# Patient Record
Sex: Male | Born: 2000 | Hispanic: Yes | Marital: Single | Smoking: Never smoker
Health system: Southern US, Community
[De-identification: ages and names within clinical notes are randomized; demographics above are authoritative.]

## PROBLEM LIST (undated history)

## (undated) DIAGNOSIS — F419 Anxiety disorder, unspecified: Secondary | ICD-10-CM

## (undated) HISTORY — PX: APPENDECTOMY: SHX54

## (undated) HISTORY — DX: Anxiety disorder, unspecified: F41.9

---
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
## 2021-01-26 ENCOUNTER — Observation Stay
Admission: EM | Admit: 2021-01-26 | Discharge: 2021-01-27 | Disposition: A | Discharge status: Home / Self Care | Payer: Medicaid Other | Attending: Surgery | Admitting: Surgery

## 2021-01-26 ENCOUNTER — Encounter
Admission: EM | Disposition: A | Discharge status: Home / Self Care | Payer: Self-pay | Source: Home / Self Care | Attending: Emergency Medicine

## 2021-01-26 ENCOUNTER — Other Ambulatory Visit: Payer: Self-pay

## 2021-01-26 ENCOUNTER — Encounter: Payer: Self-pay | Admitting: Emergency Medicine

## 2021-01-26 ENCOUNTER — Emergency Department: Payer: Medicaid Other

## 2021-01-26 DIAGNOSIS — K3580 Unspecified acute appendicitis: Secondary | ICD-10-CM | POA: Diagnosis present

## 2021-01-26 DIAGNOSIS — R1033 Periumbilical pain: Secondary | ICD-10-CM | POA: Diagnosis present

## 2021-01-26 DIAGNOSIS — K353 Acute appendicitis with localized peritonitis, without perforation or gangrene: Principal | ICD-10-CM | POA: Insufficient documentation

## 2021-01-26 DIAGNOSIS — Z20822 Contact with and (suspected) exposure to covid-19: Secondary | ICD-10-CM | POA: Insufficient documentation

## 2021-01-26 HISTORY — PX: XI ROBOTIC LAPAROSCOPIC ASSISTED APPENDECTOMY: SHX6877

## 2021-01-26 LAB — CBC
HCT: 45.1 % (ref 39.0–52.0)
Hemoglobin: 15.8 g/dL (ref 13.0–17.0)
MCH: 30 pg (ref 26.0–34.0)
MCHC: 35 g/dL (ref 30.0–36.0)
MCV: 85.6 fL (ref 80.0–100.0)
Platelets: 188 10*3/uL (ref 150–400)
RBC: 5.27 MIL/uL (ref 4.22–5.81)
RDW: 11.9 % (ref 11.5–15.5)
WBC: 13.8 10*3/uL — ABNORMAL HIGH (ref 4.0–10.5)
nRBC: 0 % (ref 0.0–0.2)

## 2021-01-26 LAB — COMPREHENSIVE METABOLIC PANEL
ALT: 24 U/L (ref 0–44)
AST: 28 U/L (ref 15–41)
Albumin: 4.9 g/dL (ref 3.5–5.0)
Alkaline Phosphatase: 50 U/L (ref 38–126)
Anion gap: 12 (ref 5–15)
BUN: 10 mg/dL (ref 6–20)
CO2: 18 mmol/L — ABNORMAL LOW (ref 22–32)
Calcium: 9.3 mg/dL (ref 8.9–10.3)
Chloride: 103 mmol/L (ref 98–111)
Creatinine, Ser: 0.54 mg/dL — ABNORMAL LOW (ref 0.61–1.24)
GFR, Estimated: >60 mL/min (ref >60)
Glucose, Bld: 122 mg/dL — ABNORMAL HIGH (ref 70–99)
Potassium: 4.3 mmol/L (ref 3.5–5.1)
Sodium: 133 mmol/L — ABNORMAL LOW (ref 135–145)
Total Bilirubin: 1.6 mg/dL — ABNORMAL HIGH (ref 0.3–1.2)
Total Protein: 7.4 g/dL (ref 6.5–8.1)

## 2021-01-26 LAB — URINALYSIS, COMPLETE (UACMP) WITH MICROSCOPIC
Bacteria, UA: NONE SEEN
Bilirubin Urine: NEGATIVE
Glucose, UA: NEGATIVE mg/dL
Hgb urine dipstick: NEGATIVE
Ketones, ur: 5 mg/dL — AB
Leukocytes,Ua: NEGATIVE
Nitrite: NEGATIVE
Protein, ur: NEGATIVE mg/dL
Specific Gravity, Urine: 1.025 (ref 1.005–1.030)
Squamous Epithelial / HPF: NONE SEEN (ref 0–5)
pH: 6 (ref 5.0–8.0)

## 2021-01-26 LAB — RESP PANEL BY RT-PCR (FLU A&B, COVID) ARPGX2
Influenza A by PCR: NEGATIVE
Influenza B by PCR: NEGATIVE
SARS Coronavirus 2 by RT PCR: NEGATIVE

## 2021-01-26 LAB — LIPASE, BLOOD: Lipase: 34 U/L (ref 11–51)

## 2021-01-26 SURGERY — APPENDECTOMY, ROBOT-ASSISTED, LAPAROSCOPIC
Anesthesia: General | Site: Abdomen

## 2021-01-26 MED ORDER — METRONIDAZOLE 500 MG/100ML IV SOLN
500.0000 mg | Freq: Three times a day (TID) | INTRAVENOUS | Status: DC
  Administered 2021-01-26: 500 mg via INTRAVENOUS
  Filled 2021-01-26 (×3): qty 100

## 2021-01-26 MED ORDER — ONDANSETRON HCL 4 MG/2ML IJ SOLN: 4.0000 mg | Freq: Four times a day (QID) | INTRAMUSCULAR | Status: DC | PRN

## 2021-01-26 MED ORDER — MIDAZOLAM HCL 2 MG/2ML IJ SOLN
INTRAMUSCULAR | Status: AC
  Filled 2021-01-26: qty 2

## 2021-01-26 MED ORDER — FENTANYL CITRATE (PF) 250 MCG/5ML IJ SOLN
INTRAMUSCULAR | Status: AC
  Filled 2021-01-26: qty 5

## 2021-01-26 MED ORDER — ONDANSETRON 4 MG PO TBDP: 4.0000 mg | ORAL_TABLET | Freq: Four times a day (QID) | ORAL | Status: DC | PRN

## 2021-01-26 MED ORDER — PROPOFOL 10 MG/ML IV BOLUS
INTRAVENOUS | Status: AC
  Filled 2021-01-26: qty 40

## 2021-01-26 MED ORDER — BUPIVACAINE HCL (PF) 0.5 % IJ SOLN
INTRAMUSCULAR | Status: AC
  Filled 2021-01-26: qty 30

## 2021-01-26 MED ORDER — CIPROFLOXACIN IN D5W 400 MG/200ML IV SOLN
400.0000 mg | Freq: Two times a day (BID) | INTRAVENOUS | Status: DC
  Administered 2021-01-26: 400 mg via INTRAVENOUS
  Filled 2021-01-26 (×2): qty 200

## 2021-01-26 MED ORDER — TRAMADOL HCL 50 MG PO TABS: 50.0000 mg | ORAL_TABLET | Freq: Four times a day (QID) | ORAL | Status: DC | PRN

## 2021-01-26 MED ORDER — SODIUM CHLORIDE 0.9 % IV SOLN: INTRAVENOUS | Status: DC

## 2021-01-26 MED ORDER — ENOXAPARIN SODIUM 40 MG/0.4ML IJ SOSY
40.0000 mg | PREFILLED_SYRINGE | INTRAMUSCULAR | Status: DC
  Filled 2021-01-26: qty 0.4

## 2021-01-26 MED ORDER — LIDOCAINE HCL (PF) 1 % IJ SOLN
INTRAMUSCULAR | Status: AC
  Filled 2021-01-26: qty 30

## 2021-01-26 MED ORDER — DOCUSATE SODIUM 100 MG PO CAPS: 100.0000 mg | ORAL_CAPSULE | Freq: Two times a day (BID) | ORAL | Status: DC | PRN

## 2021-01-26 MED ORDER — ONDANSETRON HCL 4 MG/2ML IJ SOLN
4.0000 mg | Freq: Once | INTRAMUSCULAR | Status: AC
  Administered 2021-01-26: 4 mg via INTRAVENOUS
  Filled 2021-01-26: qty 2

## 2021-01-26 MED ORDER — MORPHINE SULFATE (PF) 4 MG/ML IV SOLN
4.0000 mg | Freq: Once | INTRAVENOUS | Status: AC
Start: 2021-01-26 — End: 2021-01-26
  Administered 2021-01-26: 4 mg via INTRAVENOUS
  Filled 2021-01-26: qty 1

## 2021-01-26 MED ORDER — MORPHINE SULFATE (PF) 2 MG/ML IV SOLN
2.0000 mg | INTRAVENOUS | Status: DC | PRN
  Administered 2021-01-26: 2 mg via INTRAVENOUS
  Filled 2021-01-26: qty 1

## 2021-01-26 MED ORDER — EPINEPHRINE PF 1 MG/ML IJ SOLN
INTRAMUSCULAR | Status: AC
  Filled 2021-01-26: qty 1

## 2021-01-26 MED ORDER — MORPHINE SULFATE (PF) 2 MG/ML IV SOLN
2.0000 mg | Freq: Once | INTRAVENOUS | Status: AC
  Administered 2021-01-26: 2 mg via INTRAVENOUS
  Filled 2021-01-26: qty 1

## 2021-01-26 MED ORDER — HYDROCODONE-ACETAMINOPHEN 5-325 MG PO TABS
1.0000 | ORAL_TABLET | ORAL | Status: DC | PRN
Start: 2021-01-26 — End: 2021-01-27
  Administered 2021-01-26: 1 via ORAL
  Filled 2021-01-26: qty 1

## 2021-01-26 MED ORDER — SODIUM CHLORIDE 0.9 % IV BOLUS
500.0000 mL | Freq: Once | INTRAVENOUS | Status: AC
  Administered 2021-01-26: 500 mL via INTRAVENOUS

## 2021-01-26 SURGICAL SUPPLY — 66 items
ANCHOR TIS RET SYS 235ML (MISCELLANEOUS) ×2 IMPLANT
BAG INFUSER PRESSURE 100CC (MISCELLANEOUS) ×2 IMPLANT
BLADE SURG SZ11 CARB STEEL (BLADE) ×2 IMPLANT
CANISTER SUCT 1200ML W/VALVE (MISCELLANEOUS) IMPLANT
CANNULA REDUC XI 12-8 STAPL (CANNULA) ×1
CANNULA REDUCER 12-8 DVNC XI (CANNULA) ×1 IMPLANT
CHLORAPREP W/TINT 26 (MISCELLANEOUS) ×2 IMPLANT
COVER TIP SHEARS 8 DVNC (MISCELLANEOUS) ×1 IMPLANT
COVER TIP SHEARS 8MM DA VINCI (MISCELLANEOUS) ×1
COVER WAND RF STERILE (DRAPES) ×2 IMPLANT
DEFOGGER SCOPE WARMER CLEARIFY (MISCELLANEOUS) ×2 IMPLANT
DERMABOND ADVANCED (GAUZE/BANDAGES/DRESSINGS) ×1
DERMABOND ADVANCED .7 DNX12 (GAUZE/BANDAGES/DRESSINGS) ×1 IMPLANT
DRAPE ARM DVNC X/XI (DISPOSABLE) ×3 IMPLANT
DRAPE COLUMN DVNC XI (DISPOSABLE) ×1 IMPLANT
DRAPE DA VINCI XI ARM (DISPOSABLE) ×3
DRAPE DA VINCI XI COLUMN (DISPOSABLE) ×1
ELECT CAUTERY BLADE 6.4 (BLADE) ×2 IMPLANT
ELECT REM PT RETURN 9FT ADLT (ELECTROSURGICAL) ×2
ELECTRODE REM PT RTRN 9FT ADLT (ELECTROSURGICAL) ×1 IMPLANT
GLOVE SURG SYN 6.5 ES PF (GLOVE) ×10 IMPLANT
GLOVE SURG UNDER POLY LF SZ7 (GLOVE) ×8 IMPLANT
GOWN STRL REUS W/ TWL LRG LVL3 (GOWN DISPOSABLE) ×4 IMPLANT
GOWN STRL REUS W/TWL LRG LVL3 (GOWN DISPOSABLE) ×4
GRASPER SUT TROCAR 14GX15 (MISCELLANEOUS) ×2 IMPLANT
IRRIGATOR SUCT 8 DISP DVNC XI (IRRIGATION / IRRIGATOR) ×1 IMPLANT
IRRIGATOR SUCTION 8MM XI DISP (IRRIGATION / IRRIGATOR) ×1
IV NS 1000ML (IV SOLUTION) ×1
IV NS 1000ML BAXH (IV SOLUTION) ×1 IMPLANT
JACKSON PRATT 7MM (INSTRUMENTS) IMPLANT
KIT TURNOVER KIT A (KITS) ×2 IMPLANT
LABEL OR SOLS (LABEL) IMPLANT
MANIFOLD NEPTUNE II (INSTRUMENTS) ×2 IMPLANT
NEEDLE HYPO 22GX1.5 SAFETY (NEEDLE) ×2 IMPLANT
NEEDLE INSUFFLATION 14GA 120MM (NEEDLE) ×2 IMPLANT
OBTURATOR OPTICAL STANDARD 8MM (TROCAR) ×1
OBTURATOR OPTICAL STND 8 DVNC (TROCAR) ×1
OBTURATOR OPTICALSTD 8 DVNC (TROCAR) ×1 IMPLANT
PACK LAP CHOLECYSTECTOMY (MISCELLANEOUS) ×2 IMPLANT
PENCIL ELECTRO HAND CTR (MISCELLANEOUS) ×2 IMPLANT
RELOAD STAPLER 2.5X45 WHT DVNC (STAPLE) ×1 IMPLANT
RELOAD STAPLER 3.5X45 BLU DVNC (STAPLE) ×1 IMPLANT
SEAL CANN UNIV 5-8 DVNC XI (MISCELLANEOUS) ×3 IMPLANT
SEAL XI 5MM-8MM UNIVERSAL (MISCELLANEOUS) ×3
SET TUBE SMOKE EVAC HIGH FLOW (TUBING) ×2 IMPLANT
SOLUTION ELECTROLUBE (MISCELLANEOUS) ×2 IMPLANT
STAPLER 45 DA VINCI SURE FORM (STAPLE) ×1
STAPLER 45 SUREFORM DVNC (STAPLE) ×1 IMPLANT
STAPLER CANNULA SEAL DVNC XI (STAPLE) ×1 IMPLANT
STAPLER CANNULA SEAL XI (STAPLE) ×1
STAPLER RELOAD 2.5X45 WHITE (STAPLE) ×1
STAPLER RELOAD 2.5X45 WHT DVNC (STAPLE) ×1
STAPLER RELOAD 3.5X45 BLU DVNC (STAPLE) ×1
STAPLER RELOAD 3.5X45 BLUE (STAPLE) ×1
SUT ETHILON 3-0 FS-10 30 BLK (SUTURE)
SUT MNCRL 4-0 (SUTURE) ×1
SUT MNCRL 4-0 27XMFL (SUTURE) ×1
SUT MNCRL AB 4-0 PS2 18 (SUTURE) ×2 IMPLANT
SUT SILK 3 0 SH 30 (SUTURE) ×2 IMPLANT
SUT VIC AB 3-0 SH 27 (SUTURE) ×1
SUT VIC AB 3-0 SH 27X BRD (SUTURE) ×1 IMPLANT
SUT VICRYL 0 AB UR-6 (SUTURE) ×2 IMPLANT
SUTURE EHLN 3-0 FS-10 30 BLK (SUTURE) IMPLANT
SUTURE MNCRL 4-0 27XMF (SUTURE) ×1 IMPLANT
SYR 30ML LL (SYRINGE) ×2 IMPLANT
SYSTEM WECK SHIELD CLOSURE (TROCAR) IMPLANT

## 2021-01-26 NOTE — ED Provider Notes (Signed)
[facility] Emergency Department Provider Note   ____________________________________________    I have reviewed the triage vital signs and the nursing notes.   HISTORY  Chief Complaint Abdominal Pain     HPI Brendan Lloyd is a 20 y.o. male who presents with complaints of abdominal pain.  Patient describes periumbilical abdominal pain which started early this morning and has worsened throughout the day.  No history of abdominal surgery.  No fevers or chills.  Positive nausea no vomiting.  Normal stools.  Has not take anything for this.  No sick contacts reported.  No viral symptoms.   History reviewed. No pertinent past medical history.  There are no problems to display for this patient.   History reviewed. No pertinent surgical history.  Prior to Admission medications   Not on File     Allergies Amoxicillin  History reviewed. No pertinent family history.  Social History    Review of Systems  Constitutional: No fever/chills Eyes: No visual changes.  ENT: No sore throat. Cardiovascular: Denies chest pain. Respiratory: Denies shortness of breath. Gastrointestinal: As above Genitourinary: Negative for dysuria. Musculoskeletal: Negative for back pain. Skin: Negative for rash. Neurological: Negative for headaches or weakness   ____________________________________________   PHYSICAL EXAM:  VITAL SIGNS: ED Triage Vitals  Enc Vitals Group     BP 01/26/21 1704 121/87     Pulse Rate 01/26/21 1704 61     Resp 01/26/21 1704 18     Temp 01/26/21 1704 97.7 F (36.5 C)     Temp Source 01/26/21 1704 Oral     SpO2 01/26/21 1704 100 %     Weight 01/26/21 1659 56.7 kg (125 lb)     Height 01/26/21 1659 1.676 m (5\' 6" )     Head Circumference --      Peak Flow --      Pain Score 01/26/21 1659 10     Pain Loc --      Pain Edu? --      Excl. in GC? --     Constitutional: Alert and oriented. No acute distress.  Nose: No  congestion/rhinnorhea. Mouth/Throat: Mucous membranes are moist.   Neck:  Painless ROM Cardiovascular: Normal rate, regular rhythm. Grossly normal heart sounds.  Good peripheral circulation. Respiratory: Normal respiratory effort.  No retractions. Lungs CTAB. Gastrointestinal: Soft, periumbilical abdominal tenderness, no McBurney's point tenderness. No distention.  No CVA tenderness.  Musculoskeletal: Warm and well perfused Neurologic:  Normal speech and language. No gross focal neurologic deficits are appreciated.  Skin:  Skin is warm, dry and intact. No rash noted. Psychiatric: Mood and affect are normal. Speech and behavior are normal.  ____________________________________________   LABS (all labs ordered are listed, but only abnormal results are displayed)  Labs Reviewed  URINALYSIS, COMPLETE (UACMP) WITH MICROSCOPIC - Abnormal; Notable for the following components:      Result Value   Color, Urine YELLOW (*)    APPearance CLEAR (*)    Ketones, ur 5 (*)    All other components within normal limits  RESP PANEL BY RT-PCR (FLU A&B, COVID) ARPGX2  LIPASE, BLOOD  COMPREHENSIVE METABOLIC PANEL  CBC   ____________________________________________  EKG  None ____________________________________________  RADIOLOGY  CT reviewed by me, appendix is dilated, concerning for acute appendicitis ____________________________________________   PROCEDURES  Procedure(s) performed: No  Procedures   Critical Care performed: No ____________________________________________   INITIAL IMPRESSION / ASSESSMENT AND PLAN / ED COURSE  Pertinent labs & imaging results that were  available during my care of the patient were reviewed by me and considered in my medical decision making (see chart for details).  Patient presents with abdominal pain described above, periumbilical abdominal pain worsening over the last 24 hours, suspicious for appendicitis however no significant right lower  quadrant tenderness to palpation differential also includes enteritis, less likely colitis.  Pending labs, will treat with IV morphine, IV Zofran, IV fluids, will obtain CT and reevaluate  CT consistent with acute appendicitis, have consulted Dr. Tonna Boehringer of general surgery    ____________________________________________   FINAL CLINICAL IMPRESSION(S) / ED DIAGNOSES  Final diagnoses:  Acute appendicitis with localized peritonitis, without perforation, abscess, or gangrene        Note:  This document was prepared using Dragon voice recognition software and may include unintentional dictation errors.   Jene Every, MD 01/26/21 [phone]

## 2021-01-26 NOTE — ED Triage Notes (Signed)
Pt can be heard repeatedly moaning in the lobby and repeatedly stating "hurry up hurry up hurry up".

## 2021-01-26 NOTE — H&P (Signed)
Subjective:   CC: acute appendicitis  HPI:  Brendan Lloyd is a 20 y.o. male who is consulted by Cyril Loosen for evaluation of  above cc.  Symptoms were first noted 1 day ago. Pain is sharp, periumbilical,  Associated with N/V, exacerbated by nothing specific.     Past Medical History: none reported  Past Surgical History: circumcision  Family History: reviewed and not relevant to CC  Social History:  has no history on file for tobacco use, alcohol use, and drug use.  Current Medications:  Prior to Admission medications   Not on File    Allergies:  Allergies as of 01/26/2021 - Review Complete 01/26/2021  Allergen Reaction Noted  . Amoxicillin Anaphylaxis 01/26/2021    ROS:  General: Denies weight loss, weight gain, fatigue, fevers, chills, and night sweats. Eyes: Denies blurry vision, double vision, eye pain, itchy eyes, and tearing. Ears: Denies hearing loss, earache, and ringing in ears. Nose: Denies sinus pain, congestion, infections, runny nose, and nosebleeds. Mouth/throat: Denies hoarseness, sore throat, bleeding gums, and difficulty swallowing. Heart: Denies chest pain, palpitations, racing heart, irregular heartbeat, leg pain or swelling, and decreased activity tolerance. Respiratory: Denies breathing difficulty, shortness of breath, wheezing, cough, and sputum. GI: Denies change in appetite, heartburn, constipation, diarrhea, and blood in stool. GU: Denies difficulty urinating, pain with urinating, urgency, frequency, blood in urine. Musculoskeletal: Denies joint stiffness, pain, swelling, muscle weakness. Skin: Denies rash, itching, mass, tumors, sores, and boils Neurologic: Denies headache, fainting, dizziness, seizures, numbness, and tingling. Psychiatric: Denies depression, anxiety, difficulty sleeping, and memory loss. Endocrine: Denies heat or cold intolerance, and increased thirst or urination. Blood/lymph: Denies easy bruising, easy bruising, and swollen  glands     Objective:     BP 121/87 (BP Location: Left Arm)   Pulse 61   Temp 97.7 F (36.5 C) (Oral)   Resp 18   Ht 5\' 6"  (1.676 m)   Wt 56.7 kg   SpO2 100%   BMI 20.18 kg/m    Constitutional :  alert, cooperative, appears stated age and no distress  Lymphatics/Throat:  no asymmetry, masses, or scars  Respiratory:  clear to auscultation bilaterally  Cardiovascular:  regular rate and rhythm  Gastrointestinal: soft, no guarding, minor TTP in periumbilical area.   Musculoskeletal: Steady gait and movement  Skin: Cool and moist  Psychiatric: Normal affect, non-agitated, not confused       LABS:  CMP Latest Ref Rng & Units 01/26/2021  Glucose 70 - 99 mg/dL 03/28/2021)  BUN 6 - 20 mg/dL 10  Creatinine 144(Y - 1.85 mg/dL 6.31)  Sodium 4.97(W - 263 mmol/L 133(L)  Potassium 3.5 - 5.1 mmol/L 4.3  Chloride 98 - 111 mmol/L 103  CO2 22 - 32 mmol/L 18(L)  Calcium 8.9 - 10.3 mg/dL 9.3  Total Protein 6.5 - 8.1 g/dL 7.4  Total Bilirubin 0.3 - 1.2 mg/dL 785)  Alkaline Phos 38 - 126 U/L 50  AST 15 - 41 U/L 28  ALT 0 - 44 U/L 24   CBC Latest Ref Rng & Units 01/26/2021  WBC 4.0 - 10.5 K/uL 13.8(H)  Hemoglobin 13.0 - 17.0 g/dL 03/28/2021  Hematocrit 02.7 - 52.0 % 45.1  Platelets 150 - 400 K/uL 188     RADS: CLINICAL DATA:  Right lower quadrant abdominal pain  EXAM: CT ABDOMEN AND PELVIS WITHOUT CONTRAST  TECHNIQUE: Multidetector CT imaging of the abdomen and pelvis was performed following the standard protocol without IV contrast.  COMPARISON:  None.  FINDINGS: Lower chest: Lung  bases are clear.  Hepatobiliary: Unenhanced liver is unremarkable.  Gallbladder is unremarkable. No intrahepatic or extrahepatic ductal dilatation.  Pancreas: Within normal limits.  Spleen: Within normal limits.  Adrenals/Urinary Tract: Adrenal glands are within normal limits.  Kidneys are within normal limits. No renal, ureteral, or bladder calculi. No hydronephrosis.  Bladder is  within normal limits.  Stomach/Bowel: Stomach is within normal limits.  No evidence of bowel obstruction.  Abnormal appearance of the appendix, with associated appendicolith (series 2/image 46) in dilatation of the mid appendix measuring up to 11 mm (series 2/image 48), suspicious for early acute appendicitis.  No drainable fluid collection/abscess. No free air to suggest macroscopic perforation.  Mild wall thickening involving the cecum (series 2/image 50), favored to be secondary/reactive.  Vascular/Lymphatic: No evidence of abdominal aortic aneurysm.  No suspicious abdominopelvic lymphadenopathy.  Reproductive: 19 mm cystic lesion midline posterior prostate (series 2/image 33), favoring a prostatic utricle cyst.  Other: No abdominopelvic ascites.  Musculoskeletal: Visualized osseous structures are within normal limits.  IMPRESSION: Abnormal appendix, favoring early acute appendicitis, with associated appendicolith.  No evidence of complication.   Electronically Signed   By: Charline Bills M.D.   On: 01/26/2021 18:18  Assessment:      Acute appendicitis  Plan:      Discussed the risk of surgery including post-op infxn, seroma, hematoma, abscess formation, chronic pain, poor-delayed wound healing, possible bowel resection, possible ostomy, possible conversion to open procedure, post-op SBO or ileus, and need for additional procedures to address said risks.  The risks of general anesthetic including MI, CVA, sudden death or even reaction to anesthetic medications also discussed. Alternatives include continued observation, or antibiotic treatment.  Benefits include possible symptom relief,   Typical post operative recovery of 3-5 days rest, also discussed.  The patient and father at bedside understands the risks, any and all questions were answered to the patient's satisfaction.  IVF, abx, NPO, pain control until OR available for robotic  assisted lap appy.

## 2021-01-26 NOTE — ED Triage Notes (Signed)
Pt to ED via POV with c/o lower abdominal pain that started this morning. Pt states attempted to have BM earlier but was unable to have a BM. Pt also endorses emesis at this time.

## 2021-01-26 NOTE — ED Triage Notes (Signed)
Pt can be heard yelling out "who do I have to talk to get seen? Hurry up!" This RN spoke with patient, apologized for delay, explained VSS, and that patient would be pulled back for blood work shortly. Pt states understanding given a urine cup for urine.

## 2021-01-27 ENCOUNTER — Inpatient Hospital Stay: Payer: Medicaid Other | Admitting: Anesthesiology

## 2021-01-27 ENCOUNTER — Encounter: Payer: Self-pay | Admitting: Surgery

## 2021-01-27 LAB — BASIC METABOLIC PANEL
Anion gap: 4 — ABNORMAL LOW (ref 5–15)
BUN: 10 mg/dL (ref 6–20)
CO2: 24 mmol/L (ref 22–32)
Calcium: 8.9 mg/dL (ref 8.9–10.3)
Chloride: 102 mmol/L (ref 98–111)
Creatinine, Ser: 0.62 mg/dL (ref 0.61–1.24)
GFR, Estimated: >60 mL/min (ref >60)
Glucose, Bld: 153 mg/dL — ABNORMAL HIGH (ref 70–99)
Potassium: 3.8 mmol/L (ref 3.5–5.1)
Sodium: 130 mmol/L — ABNORMAL LOW (ref 135–145)

## 2021-01-27 LAB — CBC
HCT: 41.3 % (ref 39.0–52.0)
Hemoglobin: 14.8 g/dL (ref 13.0–17.0)
MCH: 30 pg (ref 26.0–34.0)
MCHC: 35.8 g/dL (ref 30.0–36.0)
MCV: 83.6 fL (ref 80.0–100.0)
Platelets: 235 10*3/uL (ref 150–400)
RBC: 4.94 MIL/uL (ref 4.22–5.81)
RDW: 11.9 % (ref 11.5–15.5)
WBC: 14.1 10*3/uL — ABNORMAL HIGH (ref 4.0–10.5)
nRBC: 0 % (ref 0.0–0.2)

## 2021-01-27 LAB — MAGNESIUM: Magnesium: 1.7 mg/dL (ref 1.7–2.4)

## 2021-01-27 LAB — HIV ANTIBODY (ROUTINE TESTING W REFLEX): HIV Screen 4th Generation wRfx: NONREACTIVE

## 2021-01-27 LAB — PHOSPHORUS: Phosphorus: 2.8 mg/dL (ref 2.5–4.6)

## 2021-01-27 MED ORDER — ACETAMINOPHEN 10 MG/ML IV SOLN
INTRAVENOUS | Status: AC
  Filled 2021-01-27: qty 100

## 2021-01-27 MED ORDER — OXYCODONE HCL 5 MG PO TABS: 5.0000 mg | ORAL_TABLET | Freq: Once | ORAL | Status: DC | PRN

## 2021-01-27 MED ORDER — DOCUSATE SODIUM 100 MG PO CAPS: 100.0000 mg | ORAL_CAPSULE | Freq: Two times a day (BID) | ORAL | 0 refills | Status: AC | PRN

## 2021-01-27 MED ORDER — MIDAZOLAM HCL 2 MG/2ML IJ SOLN
INTRAMUSCULAR | Status: DC | PRN
  Administered 2021-01-27: 2 mg via INTRAVENOUS

## 2021-01-27 MED ORDER — SEVOFLURANE IN SOLN
RESPIRATORY_TRACT | Status: AC
  Filled 2021-01-27: qty 250

## 2021-01-27 MED ORDER — DEXAMETHASONE SODIUM PHOSPHATE 10 MG/ML IJ SOLN
INTRAMUSCULAR | Status: DC | PRN
  Administered 2021-01-27: 10 mg via INTRAVENOUS

## 2021-01-27 MED ORDER — ACETAMINOPHEN 10 MG/ML IV SOLN: 1000.0000 mg | Freq: Once | INTRAVENOUS | Status: DC | PRN

## 2021-01-27 MED ORDER — IBUPROFEN 800 MG PO TABS: 800.0000 mg | ORAL_TABLET | Freq: Three times a day (TID) | ORAL | 0 refills | Status: DC | PRN

## 2021-01-27 MED ORDER — OXYCODONE HCL 5 MG/5ML PO SOLN: 5.0000 mg | Freq: Once | ORAL | Status: DC | PRN

## 2021-01-27 MED ORDER — ACETAMINOPHEN 325 MG PO TABS: 650.0000 mg | ORAL_TABLET | Freq: Three times a day (TID) | ORAL | 0 refills | Status: AC | PRN

## 2021-01-27 MED ORDER — LIDOCAINE-EPINEPHRINE (PF) 1 %-1:200000 IJ SOLN
INTRAMUSCULAR | Status: DC | PRN
  Administered 2021-01-27: 7.5 mL

## 2021-01-27 MED ORDER — PROPOFOL 10 MG/ML IV BOLUS
INTRAVENOUS | Status: DC | PRN
  Administered 2021-01-27: 150 mg via INTRAVENOUS

## 2021-01-27 MED ORDER — FENTANYL CITRATE (PF) 100 MCG/2ML IJ SOLN: 25.0000 ug | INTRAMUSCULAR | Status: DC | PRN

## 2021-01-27 MED ORDER — SUCCINYLCHOLINE CHLORIDE 20 MG/ML IJ SOLN
INTRAMUSCULAR | Status: DC | PRN
  Administered 2021-01-27: 80 mg via INTRAVENOUS

## 2021-01-27 MED ORDER — HYDROCODONE-ACETAMINOPHEN 5-325 MG PO TABS: 1.0000 | ORAL_TABLET | Freq: Four times a day (QID) | ORAL | 0 refills | Status: DC | PRN

## 2021-01-27 MED ORDER — FENTANYL CITRATE (PF) 100 MCG/2ML IJ SOLN
INTRAMUSCULAR | Status: DC | PRN
  Administered 2021-01-27: 100 ug via INTRAVENOUS
  Administered 2021-01-27 (×2): 50 ug via INTRAVENOUS

## 2021-01-27 MED ORDER — BUPIVACAINE HCL (PF) 0.5 % IJ SOLN
INTRAMUSCULAR | Status: DC | PRN
  Administered 2021-01-27: 7.5 mL

## 2021-01-27 MED ORDER — ACETAMINOPHEN 10 MG/ML IV SOLN
INTRAVENOUS | Status: DC | PRN
  Administered 2021-01-27: 1000 mg via INTRAVENOUS

## 2021-01-27 MED ORDER — SUGAMMADEX SODIUM 200 MG/2ML IV SOLN
INTRAVENOUS | Status: DC | PRN
  Administered 2021-01-27: 150 mg via INTRAVENOUS

## 2021-01-27 MED ORDER — ONDANSETRON HCL 4 MG/2ML IJ SOLN
INTRAMUSCULAR | Status: DC | PRN
  Administered 2021-01-27: 4 mg via INTRAVENOUS

## 2021-01-27 MED ORDER — ONDANSETRON HCL 4 MG/2ML IJ SOLN: 4.0000 mg | Freq: Once | INTRAMUSCULAR | Status: DC | PRN

## 2021-01-27 MED ORDER — LIDOCAINE HCL (CARDIAC) PF 100 MG/5ML IV SOSY
PREFILLED_SYRINGE | INTRAVENOUS | Status: DC | PRN
  Administered 2021-01-27: 60 mg via INTRAVENOUS

## 2021-01-27 MED ORDER — METHYLENE BLUE 0.5 % INJ SOLN
INTRAVENOUS | Status: AC
  Filled 2021-01-27: qty 10

## 2021-01-27 MED ORDER — ROCURONIUM BROMIDE 100 MG/10ML IV SOLN
INTRAVENOUS | Status: DC | PRN
  Administered 2021-01-27: 30 mg via INTRAVENOUS

## 2021-01-27 NOTE — Anesthesia Procedure Notes (Signed)
Procedure Name: Intubation Date/Time: 01/27/2021 12:34 AM Performed by: Waldo Laine, CRNA Pre-anesthesia Checklist: Patient identified, Patient being monitored, Timeout performed, Emergency Drugs available and Suction available Patient Re-evaluated:Patient Re-evaluated prior to induction Oxygen Delivery Method: Circle system utilized Preoxygenation: Pre-oxygenation with 100% oxygen Induction Type: IV induction and Rapid sequence Laryngoscope Size: 3 and McGraph Grade View: Grade I Tube type: Oral Tube size: 7.0 mm Number of attempts: 1 Airway Equipment and Method: Stylet Placement Confirmation: ETT inserted through vocal cords under direct vision,  positive ETCO2 and breath sounds checked- equal and bilateral Secured at: 21 cm Tube secured with: Tape Dental Injury: Teeth and Oropharynx as per pre-operative assessment

## 2021-01-27 NOTE — Anesthesia Preprocedure Evaluation (Signed)
Anesthesia Evaluation  Patient identified by MRN, date of birth, ID band Patient awake    Reviewed: Allergy & Precautions, NPO status , Patient's Chart, lab work & pertinent test results  History of Anesthesia Complications Negative for: history of anesthetic complications  Airway Mallampati: III  TM Distance: >3 FB Neck ROM: Full    Dental no notable dental hx. (+) Teeth Intact   Pulmonary neg pulmonary ROS, neg sleep apnea, neg COPD, Patient abstained from smoking.Not current smoker,    Pulmonary exam normal breath sounds clear to auscultation       Cardiovascular Exercise Tolerance: Good METS(-) hypertension(-) CAD and (-) Past MI negative cardio ROS  (-) dysrhythmias  Rhythm:Regular Rate:Normal - Systolic murmurs    Neuro/Psych negative neurological ROS  negative psych ROS   GI/Hepatic neg GERD  ,(+)     (-) substance abuse  ,   Endo/Other  neg diabetes  Renal/GU negative Renal ROS     Musculoskeletal   Abdominal   Peds  Hematology   Anesthesia Other Findings History reviewed. No pertinent past medical history.  Reproductive/Obstetrics                             Anesthesia Physical Anesthesia Plan  ASA: II  Anesthesia Plan: General   Post-op Pain Management:    Induction: Intravenous  PONV Risk Score and Plan: 3 and Ondansetron, Dexamethasone and Midazolam  Airway Management Planned: Oral ETT  Additional Equipment: None  Intra-op Plan:   Post-operative Plan: Extubation in OR  Informed Consent: I have reviewed the patients History and Physical, chart, labs and discussed the procedure including the risks, benefits and alternatives for the proposed anesthesia with the patient or authorized representative who has indicated his/her understanding and acceptance.     Dental advisory given  Plan Discussed with: CRNA and Surgeon  Anesthesia Plan Comments: (Discussed  risks of anesthesia with patient, including PONV, sore throat, lip/dental damage. Rare risks discussed as well, such as cardiorespiratory and neurological sequelae. Patient understands.)        Anesthesia Quick Evaluation

## 2021-01-27 NOTE — Transfer of Care (Signed)
Immediate Anesthesia Transfer of Care Note  Patient: Brendan Lloyd  Procedure(s) Performed: XI ROBOTIC LAPAROSCOPIC ASSISTED APPENDECTOMY (N/A Abdomen)  Patient Location: PACU  Anesthesia Type:General  Level of Consciousness: sedated  Airway & Oxygen Therapy: Patient Spontanous Breathing and Patient connected to face mask oxygen  Post-op Assessment: Report given to RN and Post -op Vital signs reviewed and stable  Post vital signs: Reviewed and stable  Last Vitals:  Vitals Value Taken Time  BP    Temp    Pulse 97 01/27/21 0229  Resp 19 01/27/21 0229  SpO2 99 % 01/27/21 0229  Vitals shown include unvalidated device data.  Last Pain:  Vitals:   01/26/21 2340  TempSrc:   PainSc: 6       Patients Stated Pain Goal: 0 (01/26/21 2259)  Complications: No complications documented.

## 2021-01-27 NOTE — OR Nursing (Signed)
Pt voided prior to Or transport per MD verbal order.

## 2021-01-27 NOTE — Discharge Instructions (Signed)
Laparoscopic Appendectomy, Care After This sheet gives you information about how to care for yourself after your procedure. Your doctor may also give you more specific instructions. If you have problems or questions, contact your doctor. Follow these instructions at home: Care for cuts from surgery (incisions)   Follow instructions from your doctor about how to take care of your cuts from surgery. Make sure you: ? Wash your hands with soap and water before you change your bandage (dressing). If you cannot use soap and water, use hand sanitizer. ? Change your bandage as told by your doctor. ? Leave stitches (sutures), skin glue, or skin tape (adhesive) strips in place. They may need to stay in place for 2 weeks or longer. If tape strips get loose and curl up, you may trim the loose edges. Do not remove tape strips completely unless your doctor says it is okay.  Do not take baths, swim, or use a hot tub until your doctor says it is okay. OK TO SHOWER 24HRS AFTER YOUR SURGERY.   Check your surgical cut area every day for signs of infection. Check for: ? More redness, swelling, or pain. ? More fluid or blood. ? Warmth. ? Pus or a bad smell. Activity  Do not drive or use heavy machinery while taking prescription pain medicine.  Do not play contact sports until your doctor says it is okay.  Do not drive for 24 hours if you were given a medicine to help you relax (sedative).  Rest as needed. Do not return to work or school until your doctor says it is okay. General instructions .  tylenol and advil as needed for discomfort.  Please alternate between the two every four hours as needed for pain.   .  Use narcotics, if prescribed, only when tylenol and motrin is not enough to control pain. .  325-650mg every 8hrs to max of 3000mg/24hrs (including the 325mg in every norco dose) for the tylenol.   .  Advil up to 800mg per dose every 8hrs as needed for pain.    To prevent or treat constipation  while you are taking prescription pain medicine, your doctor may recommend that you: ? Drink enough fluid to keep your pee (urine) clear or pale yellow. ? Take over-the-counter or prescription medicines. ? Eat foods that are high in fiber, such as fresh fruits and vegetables, whole grains, and beans. ? Limit foods that are high in fat and processed sugars, such as fried and sweet foods. Contact a doctor if:  You develop a rash.  You have more redness, swelling, or pain around your surgical cuts.  You have more fluid or blood coming from your surgical cuts.  Your surgical cuts feel warm to the touch.  You have pus or a bad smell coming from your surgical cuts.  You have a fever.  One or more of your surgical cuts breaks open. Get help right away if:  You have trouble breathing.  You have chest pain.  You have pain that is getting worse in your shoulders.  You faint or feel dizzy when you stand.  You have very bad pain in your belly (abdomen).  You are sick to your stomach (nauseous) for more than one day.  You have throwing up (vomiting) that lasts for more than one day.  You have leg pain. This information is not intended to replace advice given to you by your health care provider. Make sure you discuss any questions you have with your   health care provider. Document Released: 05/18/2008 Document Revised: 02/28/2016 Document Reviewed: 01/26/2016 Elsevier Interactive Patient Education  2019 [employer] Inc[employer]   

## 2021-01-27 NOTE — Progress Notes (Signed)
Brendan Lloyd to be D/C'd Home per MD order.  Discussed prescriptions and follow up appointments with the patient. Prescriptions given to patient, medication list explained in detail. Pt verbalized understanding.  Allergies as of 01/27/2021      Reactions   Amoxicillin Anaphylaxis      Medication List    TAKE these medications   acetaminophen 325 MG tablet Commonly known as: Tylenol Take 2 tablets (650 mg total) by mouth every 8 (eight) hours as needed for mild pain.   docusate sodium 100 MG capsule Commonly known as: Colace Take 1 capsule (100 mg total) by mouth 2 (two) times daily as needed for up to 10 days for mild constipation.   HYDROcodone-acetaminophen 5-325 MG tablet Commonly known as: Norco Take 1 tablet by mouth every 6 (six) hours as needed for up to 6 doses for moderate pain.   ibuprofen 800 MG tablet Commonly known as: ADVIL Take 1 tablet (800 mg total) by mouth every 8 (eight) hours as needed for mild pain or moderate pain.       Vitals:   01/27/21 0930 01/27/21 1124  BP: 136/78 134/70  Pulse: 77 83  Resp: 16   Temp: 98.5 F (36.9 C) 98.6 F (37 C)  SpO2: 98% 100%    Skin clean, dry and intact without evidence of skin break down, no evidence of skin tears noted. IV catheter discontinued intact. Site without signs and symptoms of complications. Dressing and pressure applied. Pt denies pain at this time. No complaints noted.  An After Visit Summary was printed and given to the patient. Patient escorted via WC, and D/C home via private auto.   Madie Reno, RN

## 2021-01-27 NOTE — Discharge Summary (Signed)
Physician Discharge Summary  Patient ID: Brendan Lloyd MRN: [id number] DOB/AGE: August 12, 2001 20 y.o.  Admit date: 01/26/2021 Discharge date: 01/27/21  Admission Diagnoses: acute appendicitis  Discharge Diagnoses:  Same as above  Discharged Condition: good  Hospital Course: admitted for above.  Underwent robotic lap appy.  See op note for details.  Post op, recovered as expected, with pain controlled, tolerating diet at time of d/c.  Consults: None  Discharge Exam: Blood pressure 134/70, pulse 83, temperature 98.6 F (37 C), temperature source Oral, resp. rate 16, height 5\' 6"  (1.676 m), weight 56.7 kg, SpO2 100 %. General appearance: alert, cooperative and no distress GI: abnormal findings:  soft, TTP around incision sites with some bloating secondary to insufflation  Disposition:  Discharge disposition: 01-Home or Self Care       Discharge Instructions    Discharge patient   Complete by: As directed    After tolerating regular diet for lunch   Discharge disposition: 01-Home or Self Care   Discharge patient date: 01/27/2021     Allergies as of 01/27/2021      Reactions   Amoxicillin Anaphylaxis      Medication List    TAKE these medications   acetaminophen 325 MG tablet Commonly known as: Tylenol Take 2 tablets (650 mg total) by mouth every 8 (eight) hours as needed for mild pain.   docusate sodium 100 MG capsule Commonly known as: Colace Take 1 capsule (100 mg total) by mouth 2 (two) times daily as needed for up to 10 days for mild constipation.   HYDROcodone-acetaminophen 5-325 MG tablet Commonly known as: Norco Take 1 tablet by mouth every 6 (six) hours as needed for up to 6 doses for moderate pain.   ibuprofen 800 MG tablet Commonly known as: ADVIL Take 1 tablet (800 mg total) by mouth every 8 (eight) hours as needed for mild pain or moderate pain.       Follow-up Information    03/29/2021, [name], DO Follow up in 2 week(s).   Specialty: Surgery Why: post op  lap appy Contact information: [address] [place] [phone]                Total time spent arranging discharge was >67min. Signed: 31m 01/27/2021, 12:27 PM

## 2021-01-27 NOTE — Progress Notes (Signed)
Pt. Arrived from OR @0315 . Pt. Orient and alert X 4. Pt. Denies any pain, but report mild abdominal discomfort from surgery site. Pt. HOB at 30 degree. Pt. on room air, no sign of respiratory distress. Continue to monitor

## 2021-01-27 NOTE — Op Note (Signed)
Preoperative diagnosis: acute appendicitis  Postoperative diagnosis: Same  Procedure: Robotic assisted laparoscopic appendectomy.  Anesthesia: GETA  Surgeon: Sung Amabile  Wound Classification: clean contaminated  Specimen: Appendix  Complications: None  Estimated Blood Loss: 3 mL   Indications: Patient is a 20 y.o. male  presented with above.  Please see H&P for further details.    FIndings: 1.  Irritated appendix  2. No peri-appendiceal abscess or phlegmon 3. Normal anatomy 4. Appendiceal artery ligated and divided with stapler 5. Adequate hemostasis.   Description of procedure: The patient was placed on the operating table in the supine position, left arm tucked. General anesthesia was induced. A time-out was completed verifying correct patient, procedure, site, positioning, and implant(s) and/or special equipment prior to beginning this procedure. The abdomen was prepped and draped in the usual sterile fashion.   Palmer's point located and Veress needle was inserted.  After confirming 2 clicks and a positive saline drop test, gas insufflation was initiated until the abdominal pressure was measured at 15 mmHg.  Afterwards, the Veress needle was removed and a 8 mm port was placed through a periumbilical site using Optiview technique after incision with an 11 blade.  After local was infused, 2 additional incision was made 8 cm apart each side along the left side of the abdominal wall from the initial incision.  An 8 mm port was caudaed and 63mm port cephalad from initial incision, both under direct visualization.  No injuries from trocar placements were noted. The table was placed in the Trendelenburg position with the right side elevated.  Xi robotic platform was then brought to the operative field and docked.  An inflamed appendix was identified and elevated after dissecting the lateral attachments.  Window created at base of appendix in the mesentery.   A blue load linear cutting  stapler was then used to divide and staple the base of the appendix. It was reloaded with a vascular cartridge and the mesoappendix similarly divided.  Bleeding from the mesoappendix staple line was controlled with bipolar cautery.   The appendix was placed in an endoscopic retrieval bag and removed.   The appendiceal stump and mesoappendix staple line examined again and hemostasis noted.  Examination of the abdomen noted a possible pinpoint injury with the Veress needle to the anterior portion of the stomach.  No obvious bleeding or discharge was noted.  Methylene blue was infused through the NG in the stomach distended with again no extravasation of methylene blue through the possible injury site.  Due to the very small nature and negative leak test, decision was made to forego any patient intervention at this site.    The 12 mm trocar removed and port site closed with PMI using 0 vicryl under direct vision. Remaining trocars were removed under direct vision. No bleeding was noted.The abdomen was allowed to collapse. 3-0 vicryl interrupted used to close 43mm port site at dermal level, and then all skin incisions then closed with subcuticular sutures Monocryl 4-0.  Wounds then dressed with dermabond.  The patient tolerated the procedure well, awakened from anesthesia and was taken to the postanesthesia care unit in satisfactory condition.  Sponge count and instrument count correct at the end of the procedure.

## 2021-01-27 NOTE — Anesthesia Postprocedure Evaluation (Signed)
Anesthesia Post Note  Patient: [name]  Procedure(s) Performed: XI ROBOTIC LAPAROSCOPIC ASSISTED APPENDECTOMY (N/A Abdomen)  Patient location during evaluation: PACU Anesthesia Type: General Level of consciousness: awake and alert Pain management: pain level controlled Vital Signs Assessment: post-procedure vital signs reviewed and stable Respiratory status: spontaneous breathing, nonlabored ventilation, respiratory function stable and patient connected to nasal cannula oxygen Cardiovascular status: blood pressure returned to baseline and stable Postop Assessment: no apparent nausea or vomiting Anesthetic complications: no   No complications documented.   Last Vitals:  Vitals:   01/27/21 0359 01/27/21 0500  BP: 129/69 119/62  Pulse: 89 72  Resp: 16 16  Temp: 36.6 C 36.8 C  SpO2: 100% 98%    Last Pain:  Vitals:   01/27/21 0500  TempSrc: Oral  PainSc:                  Corinda Gubler

## 2021-01-28 LAB — SURGICAL PATHOLOGY

## 2021-11-07 IMAGING — CT CT ABD-PELV W/O CM
2 of 4 series · 16 of 46 positions shown, 18 images · non-contrast
Comparison: None.

CLINICAL DATA: Right lower quadrant abdominal pain

EXAM:
CT ABDOMEN AND PELVIS WITHOUT CONTRAST
TECHNIQUE: Multidetector CT imaging of the abdomen and pelvis was performed
following the standard protocol without IV contrast.

[Series 2: axial st · axial · 0.64mm/px · z∈[-477,-72]mm · 13 of 89 slices shown, 15 images]
[im 4/89  soft-tissue]
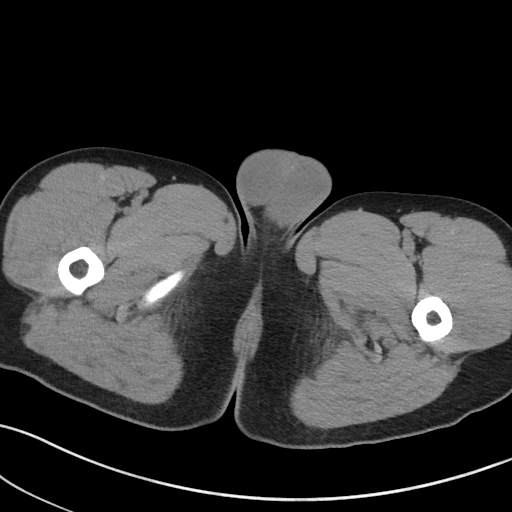
[im 4/89  bone]
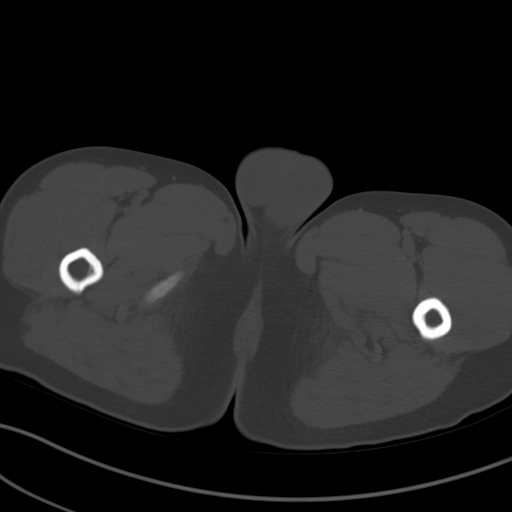
[im 11/89  soft-tissue]
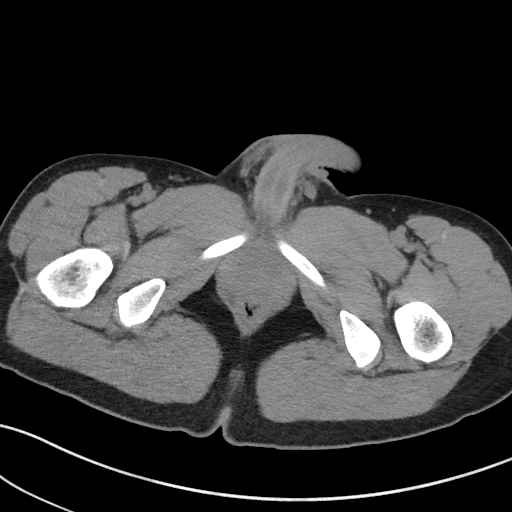
[im 17/89  soft-tissue]
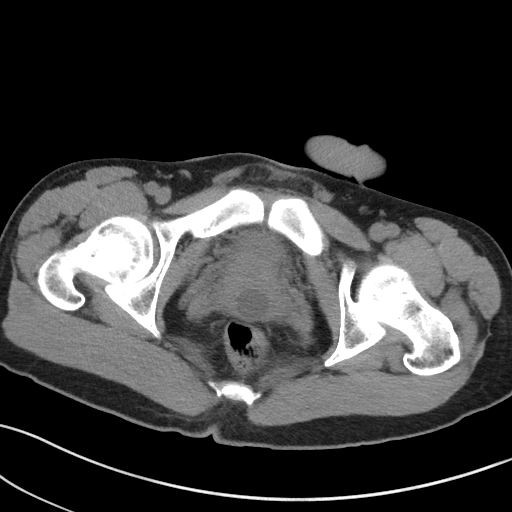
[im 24/89  soft-tissue]
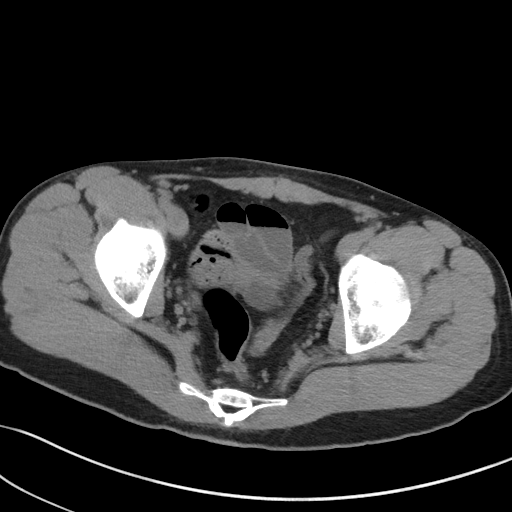
[im 31/89  soft-tissue]
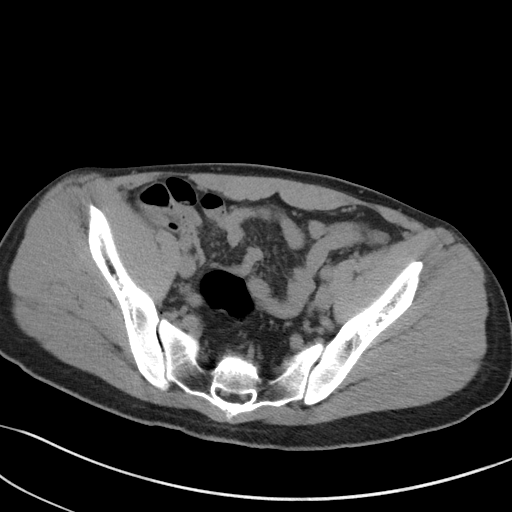
[im 38/89  soft-tissue]
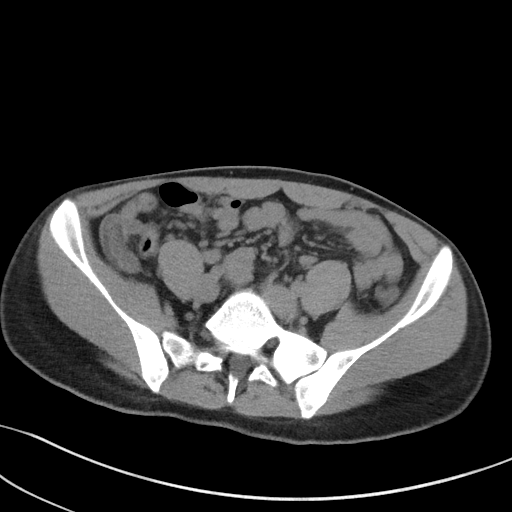
[im 45/89  soft-tissue]
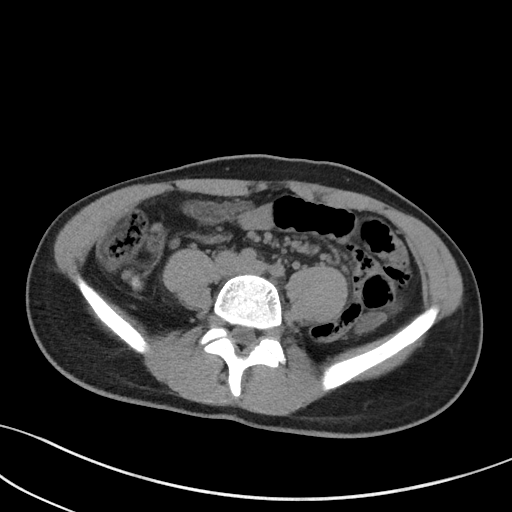
[im 51/89  soft-tissue]
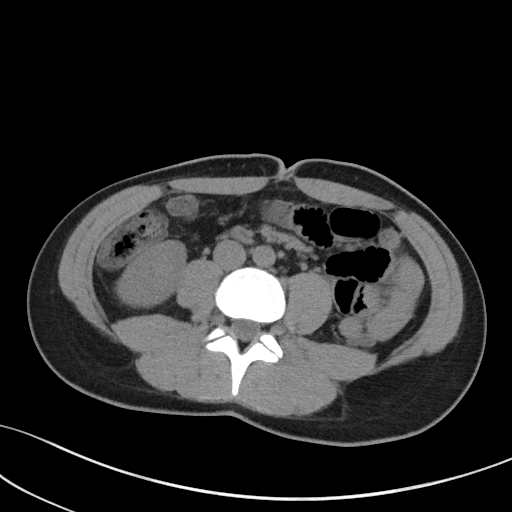
[im 58/89  soft-tissue]
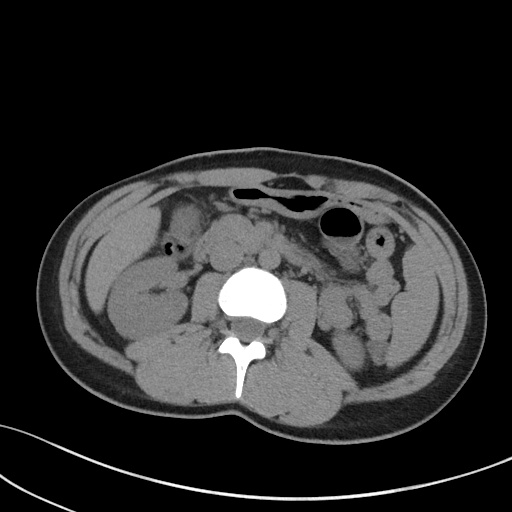
[im 58/89  bone]
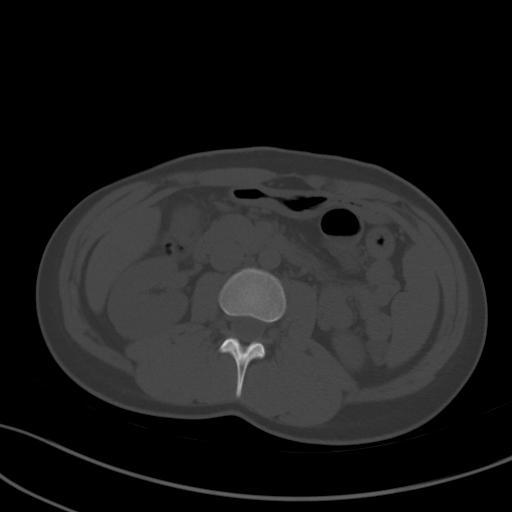
[im 65/89  soft-tissue]
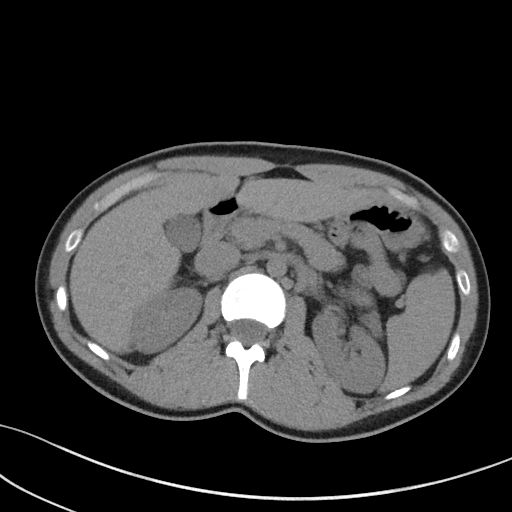
[im 72/89  soft-tissue]
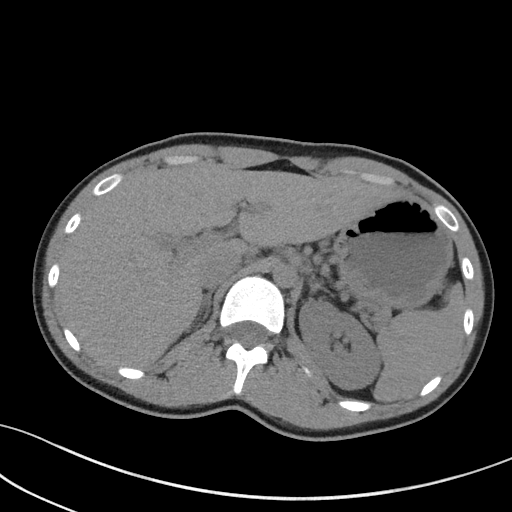
[im 78/89  soft-tissue]
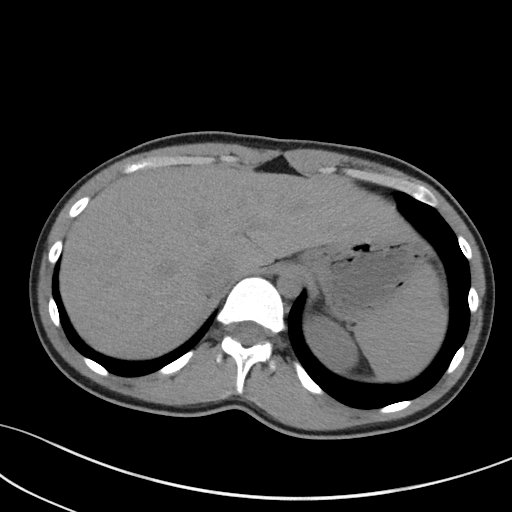
[im 85/89  soft-tissue]
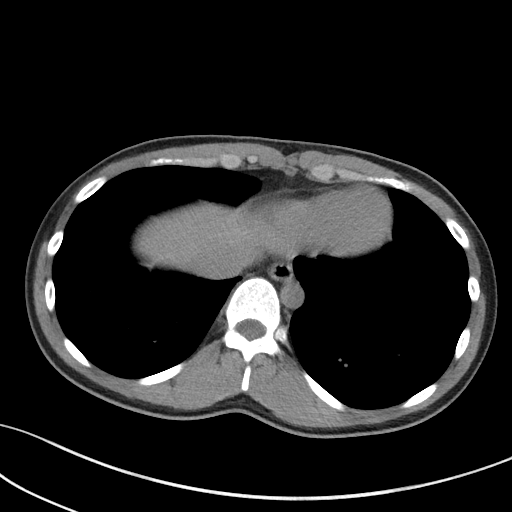

[Series 4: coronal st · coronal · 0.73mm/px · 3 of 68 slices shown]
[im 23/68  soft-tissue]
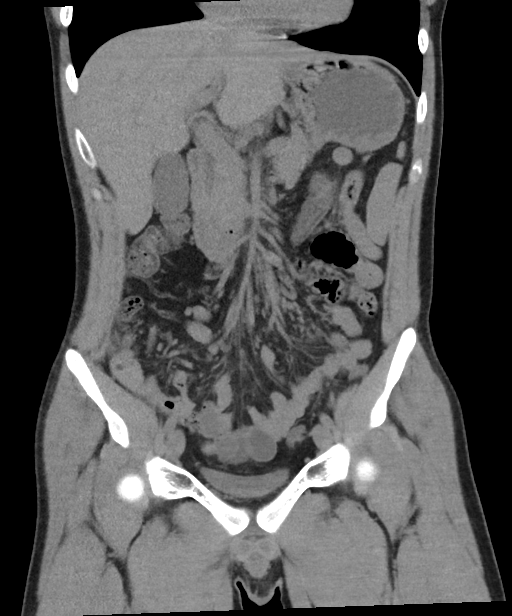
[im 30/68  soft-tissue]
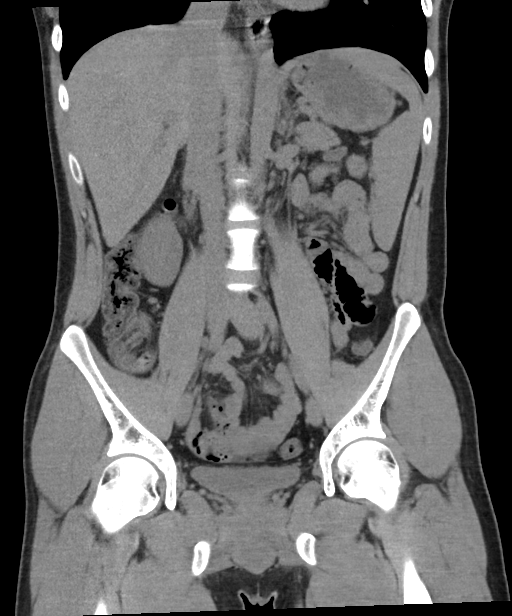
[im 38/68  soft-tissue]
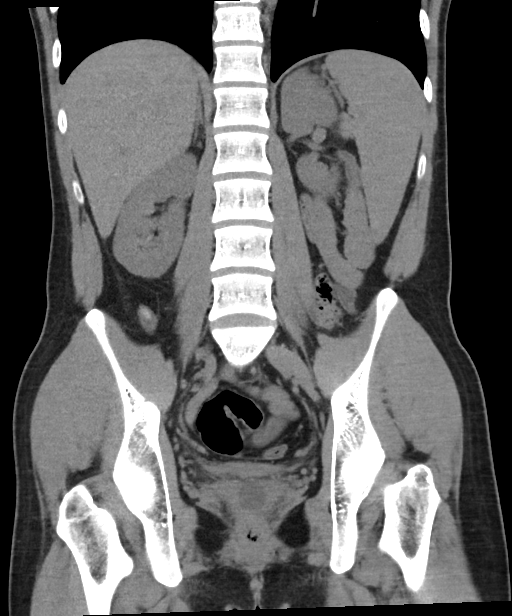

[16 of 46 positions shown; findings below may reference images not displayed]

FINDINGS: Lower chest: Lung bases are clear.

Hepatobiliary: Unenhanced liver is unremarkable.

Gallbladder is unremarkable. No intrahepatic or extrahepatic ductal
dilatation.

Pancreas: Within normal limits.

Spleen: Within normal limits.

Adrenals/Urinary Tract: Adrenal glands are within normal limits.

Kidneys are within normal limits. No renal, ureteral, or bladder
calculi. No hydronephrosis.

Bladder is within normal limits.

Stomach/Bowel: Stomach is within normal limits.

No evidence of bowel obstruction.

Abnormal appearance of the appendix, with associated appendicolith
(series 2/image 46) in dilatation of the mid appendix measuring up
to 11 mm (series 2/image 48), suspicious for early acute
appendicitis.

No drainable fluid collection/abscess. No free air to suggest
macroscopic perforation.

Mild wall thickening involving the cecum (series 2/image 50),
favored to be secondary/reactive.

Vascular/Lymphatic: No evidence of abdominal aortic aneurysm.

No suspicious abdominopelvic lymphadenopathy.

Reproductive: 19 mm cystic lesion midline posterior prostate (series
2/image 33), favoring a prostatic utricle cyst.

Other: No abdominopelvic ascites.

Musculoskeletal: Visualized osseous structures are within normal
limits.
IMPRESSION: Abnormal appendix, favoring early acute appendicitis, with
associated appendicolith.

No evidence of complication.

## 2022-06-18 ENCOUNTER — Emergency Department: Payer: Medicaid Other

## 2022-06-18 ENCOUNTER — Other Ambulatory Visit: Payer: Self-pay

## 2022-06-18 ENCOUNTER — Emergency Department
Admission: EM | Admit: 2022-06-18 | Discharge: 2022-06-18 | Disposition: A | Discharge status: Home / Self Care | Payer: Medicaid Other | Attending: Student in an Organized Health Care Education/Training Program | Admitting: Student in an Organized Health Care Education/Training Program

## 2022-06-18 ENCOUNTER — Encounter: Payer: Self-pay | Admitting: Emergency Medicine

## 2022-06-18 DIAGNOSIS — J029 Acute pharyngitis, unspecified: Secondary | ICD-10-CM | POA: Diagnosis present

## 2022-06-18 LAB — GROUP A STREP BY PCR: Group A Strep by PCR: NOT DETECTED

## 2022-06-18 NOTE — ED Triage Notes (Signed)
Pt here with a throat problem. Pt states he feels like something is poking the outside of his throat but does not feel like something is stuck in his throat like a bolus. Pt states last night he had some trouble breathing and thought he tasted blood so he wanted to be evaluated.

## 2022-06-18 NOTE — ED Notes (Signed)
See  triage note  Presents with sore throat for the past couple of days   No fever States he feels like the past is on the outside

## 2022-06-18 NOTE — ED Provider Notes (Signed)
[facility] Provider Note    Event Date/Time   First MD Initiated Contact with Patient 06/18/22 1130     (approximate)   History   Sore Throat   HPI  Brendan Lloyd is a 21 y.o. male presents to the ER for evaluation of sore throat.  Feels like something is poking the right side of his throat.  States that last night while he is driving home from school he started feeling some shortness of breath as well as the taste of blood which resolved.  No nausea or vomiting.  Went to urgent care this morning was directed to the ER for further evaluation.  Is speaking normally.  Denies any nausea or vomiting no chest pain.  No shortness of breath at this time.  States the symptoms have been ongoing for several weeks to months.  Is able to tolerate food.  Does not recall any inciting event.     Physical Exam   Triage Vital Signs: ED Triage Vitals  Enc Vitals Group     BP 06/18/22 1128 (!) 140/71     Pulse Rate 06/18/22 1128 (!) 112     Resp 06/18/22 1128 16     Temp 06/18/22 1128 98.7 F (37.1 C)     Temp Source 06/18/22 1128 Oral     SpO2 06/18/22 1128 99 %     Weight 06/18/22 1127 125 lb (56.7 kg)     Height 06/18/22 1127 5\' 6"  (1.676 m)     Head Circumference --      Peak Flow --      Pain Score 06/18/22 1127 1     Pain Loc --      Pain Edu? --      Excl. in GC? --     Most recent vital signs: Vitals:   06/18/22 1128 06/18/22 1245  BP: (!) 140/71 138/70  Pulse: (!) 112 99  Resp: 16 16  Temp: 98.7 F (37.1 C)   SpO2: 99% 99%     Constitutional: Alert  Eyes: Conjunctivae are normal.  Head: Atraumatic. Nose: No congestion/rhinnorhea. Mouth/Throat: Mucous membranes are moist.  Bilateral tonsillar erythema no exudates or purulence.  Does have some tender cervical adenopathy no cellulitic changes no fluctuance or abscess.  No findings to suggest RPA or PTA.  No angioedema.  No trismus. Neck: Painless ROM.  No stridor Cardiovascular:   Good  peripheral circulation. Respiratory: Normal respiratory effort.  No retractions.  Gastrointestinal: Soft and nontender.  Musculoskeletal:  no deformity Neurologic:  MAE spontaneously. No gross focal neurologic deficits are appreciated.  Skin:  Skin is warm, dry and intact. No rash noted. Psychiatric: Mood and affect are normal. Speech and behavior are normal.    ED Results / Procedures / Treatments   Labs (all labs ordered are listed, but only abnormal results are displayed) Labs Reviewed  GROUP A STREP BY PCR     EKG     RADIOLOGY Please see ED Course for my review and interpretation.  I personally reviewed all radiographic images ordered to evaluate for the above acute complaints and reviewed radiology reports and findings.  These findings were personally discussed with the patient.  Please see medical record for radiology report.    PROCEDURES: Procedures   MEDICATIONS ORDERED IN ED: Medications - No data to display   IMPRESSION / MDM / ASSESSMENT AND PLAN / ED COURSE  I reviewed the triage vital signs and the nursing notes.  Differential diagnosis includes, but is not limited to, pharyngitis, glottitis, glossitis, foreign body,  Patient presented to the ER for evaluation of symptoms as described above.  He is well-appearing nontoxic well-perfused protecting his airway no nausea or vomiting not consistent with food bolus impaction.  Imaging ordered for by differential.  Does have some findings concerning for mild pharyngitis not consistent with PTA or RPA.   Clinical Course as of 06/18/22 1250  Fri Jun 18, 2022  1154 X-ray on my review and interpretation does not show any evidence of foreign body or tracheal deviation. [PR]  1239 Reassessed.  Strep test negative.  Radiology report no acute findings.  Given his well appearance duration of symptoms I do not feel that further diagnostic testing clinically indicated here in the emergency  department today.  He does not have any wheezing no respiratory symptoms at this time denies any chest pain or pressure.  He is tolerating liquids and solids.  Patient feels comfortable with outpatient follow-up.  Discussed signs and symptoms for which she should return to the ER. [PR]    Clinical Course User Index [PR] Merlyn Lot, MD     FINAL CLINICAL IMPRESSION(S) / ED DIAGNOSES   Final diagnoses:  Sore throat     Rx / DC Orders   ED Discharge Orders     None        Note:  This document was prepared using Dragon voice recognition software and may include unintentional dictation errors.    Merlyn Lot, MD 06/18/22 1250

## 2022-10-11 ENCOUNTER — Ambulatory Visit (INDEPENDENT_AMBULATORY_CARE_PROVIDER_SITE_OTHER): Payer: Medicaid Other | Admitting: Physician Assistant

## 2022-10-11 ENCOUNTER — Encounter: Payer: Self-pay | Admitting: Physician Assistant

## 2022-10-11 ENCOUNTER — Other Ambulatory Visit (HOSPITAL_COMMUNITY)
Admission: RE | Admit: 2022-10-11 | Discharge: 2022-10-11 | Disposition: A | Discharge status: Home / Self Care | Payer: Medicaid Other | Source: Ambulatory Visit | Attending: Physician Assistant | Admitting: Physician Assistant

## 2022-10-11 VITALS — BP 158/81 | HR 114 | Temp 98.4°F | Ht 66.0 in | Wt 168.6 lb

## 2022-10-11 DIAGNOSIS — Z1389 Encounter for screening for other disorder: Secondary | ICD-10-CM | POA: Diagnosis not present

## 2022-10-11 DIAGNOSIS — R361 Hematospermia: Secondary | ICD-10-CM

## 2022-10-11 DIAGNOSIS — Z1159 Encounter for screening for other viral diseases: Secondary | ICD-10-CM | POA: Diagnosis not present

## 2022-10-11 DIAGNOSIS — R399 Unspecified symptoms and signs involving the genitourinary system: Secondary | ICD-10-CM | POA: Diagnosis present

## 2022-10-11 DIAGNOSIS — R030 Elevated blood-pressure reading, without diagnosis of hypertension: Secondary | ICD-10-CM

## 2022-10-11 DIAGNOSIS — Z113 Encounter for screening for infections with a predominantly sexual mode of transmission: Secondary | ICD-10-CM | POA: Diagnosis not present

## 2022-10-11 DIAGNOSIS — R21 Rash and other nonspecific skin eruption: Secondary | ICD-10-CM

## 2022-10-11 DIAGNOSIS — Z7689 Persons encountering health services in other specified circumstances: Secondary | ICD-10-CM

## 2022-10-11 LAB — POCT URINALYSIS DIPSTICK
Bilirubin, UA: NEGATIVE
Glucose, UA: NEGATIVE
Ketones, UA: NEGATIVE
Leukocytes, UA: NEGATIVE
Nitrite, UA: NEGATIVE
Protein, UA: POSITIVE — AB
Spec Grav, UA: >=1.03 — AB (ref 1.010–1.025)
Urobilinogen, UA: 0.2 E.U./dL
pH, UA: 6 (ref 5.0–8.0)

## 2022-10-11 MED ORDER — TRIAMCINOLONE ACETONIDE 0.1 % EX CREA: 1.0000 | TOPICAL_CREAM | Freq: Two times a day (BID) | CUTANEOUS | 0 refills | Status: AC

## 2022-10-11 NOTE — Progress Notes (Unsigned)
I,Connie R Striblin,acting as a Education administrator for [employer], PA-C.,have documented all relevant documentation on the behalf of Brendan Speak, PA-C,as directed by  [employer], PA-C while in the presence of [employer], PA-C.   New patient visit   Patient: Brendan Lloyd   DOB: 02-16-2001   22 y.o. Male  MRN: [id number] Visit Date: 10/11/2022  Today's healthcare provider: [name], PA-C   No chief complaint on file.  Subjective    [name] is a 22 y.o. male who presents today as a new patient to establish care.  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 Past Surgical History:  Procedure Laterality Date   XI ROBOTIC LAPAROSCOPIC ASSISTED APPENDECTOMY N/A 01/26/2021   Procedure: XI ROBOTIC LAPAROSCOPIC ASSISTED APPENDECTOMY;  Surgeon: Benjamine Sprague, DO;  Location: [facility] ORS;  Service: General;  Laterality: N/A;   Family Status  Relation Name Status   Mother Sharla Kidney Alive   Family History  Problem Relation Age of Onset   Endometriosis Mother    Social History   Socioeconomic History   Marital status: Single    Spouse name: Not on file   Number of children: Not on file   Years of education: Not on file   Highest education level: Not on file  Occupational History   Not on file  Tobacco Use   Smoking status: Never   Smokeless tobacco: Never  Substance and Sexual Activity   Alcohol use: Never   Drug use: Never   Sexual activity: Never  Other Topics Concern   Not on file  Social History Narrative   Not on file   Social Determinants of Health   Financial Resource Strain: Not on file  Food Insecurity: Not on file  Transportation Needs: Not on file  Physical Activity: Not  on file  Stress: Not on file  Social Connections: Not on file   No outpatient medications prior to visit.   No facility-administered medications prior to visit.   Allergies  Allergen Reactions   Amoxicillin Anaphylaxis    Immunization History  Administered Date(s) Administered   PFIZER(Purple Top)SARS-COV-2 Vaccination 12/14/2019, 01/05/2020    Health Maintenance  Topic Date Due   HPV VACCINES (1 - Male 2-dose series) Never done   Hepatitis C Screening  Never done   DTaP/Tdap/Td (1 - Tdap) Never done   INFLUENZA VACCINE  Never done   COVID-19 Vaccine (3 - 2023-24 season) 04/23/2022   HIV Screening  Completed    Patient Care Team: Pa, [facility] as PCP - General  Review of Systems  HENT:  Positive for tinnitus.   Respiratory:  Positive for choking and chest tightness.   Skin:  Positive for rash.  Neurological:  Positive for facial asymmetry.  All other systems reviewed and are negative.  {Labs  Heme  Chem  Endocrine  Serology  Results Review (optional):23779}   Objective    There were no vitals taken for this visit. {Show previous vital signs (optional):23777}  Physical Exam ***  Depression Screen     No data to display         No results found for any visits on 10/11/22.  Assessment & Plan     ***  No follow-ups on file.     {provider attestation***:1}   Brendan Speak, PA-C  [place] [phone] (phone) [phone] (fax)  [place]

## 2022-10-12 LAB — MICROALBUMIN / CREATININE URINE RATIO
Creatinine, Urine: 214.3 mg/dL
Microalb/Creat Ratio: 11 mg/g creat (ref 0–29)
Microalbumin, Urine: 23.1 ug/mL

## 2022-10-12 LAB — HIV ANTIBODY (ROUTINE TESTING W REFLEX): HIV Screen 4th Generation wRfx: NONREACTIVE

## 2022-10-12 LAB — HEPATITIS C ANTIBODY: Hep C Virus Ab: NONREACTIVE

## 2022-10-12 LAB — RPR: RPR Ser Ql: NONREACTIVE

## 2022-10-12 NOTE — Progress Notes (Signed)
Hi sorry I didn't order these labs . Must be an error  Dr Jonathon Bellows MD,MRCP [facility] Gastroenterology/Hepatology Pager: [phone]

## 2022-10-13 LAB — URINE CYTOLOGY ANCILLARY ONLY
Bacterial Vaginitis-Urine: NEGATIVE
Candida Urine: NEGATIVE
Chlamydia: NEGATIVE
Comment: NEGATIVE
Comment: NEGATIVE
Comment: NORMAL
Neisseria Gonorrhea: NEGATIVE
Trichomonas: NEGATIVE

## 2022-10-14 LAB — URINE CULTURE

## 2022-10-19 ENCOUNTER — Ambulatory Visit (INDEPENDENT_AMBULATORY_CARE_PROVIDER_SITE_OTHER): Payer: Medicaid Other | Admitting: Physician Assistant

## 2022-10-19 ENCOUNTER — Encounter: Payer: Self-pay | Admitting: Physician Assistant

## 2022-10-19 VITALS — BP 134/84 | HR 86 | Ht 66.0 in | Wt 170.7 lb

## 2022-10-19 DIAGNOSIS — F339 Major depressive disorder, recurrent, unspecified: Secondary | ICD-10-CM

## 2022-10-19 DIAGNOSIS — R361 Hematospermia: Secondary | ICD-10-CM | POA: Diagnosis not present

## 2022-10-19 DIAGNOSIS — R21 Rash and other nonspecific skin eruption: Secondary | ICD-10-CM | POA: Diagnosis not present

## 2022-10-19 DIAGNOSIS — R030 Elevated blood-pressure reading, without diagnosis of hypertension: Secondary | ICD-10-CM

## 2022-10-19 DIAGNOSIS — E663 Overweight: Secondary | ICD-10-CM

## 2022-10-19 DIAGNOSIS — F419 Anxiety disorder, unspecified: Secondary | ICD-10-CM

## 2022-10-19 DIAGNOSIS — F900 Attention-deficit hyperactivity disorder, predominantly inattentive type: Secondary | ICD-10-CM

## 2022-10-19 NOTE — Progress Notes (Unsigned)
I,Sha'taria Tyson,acting as a Education administrator for [employer], PA-C.,have documented all relevant documentation on the behalf of [name], PA-C,as directed by  [employer], PA-C while in the presence of [employer], PA-C.   Established patient visit   Patient: Brendan Lloyd   DOB: [birth year]   22 y.o. Male  MRN: [id number] Visit Date: 10/19/2022  Today's healthcare provider: [name], PA-C   CC: HTN and obesity FU  Subjective    HPI  Hypertension, follow-up  BP Readings from Last 3 Encounters:  10/19/22 134/84  10/11/22 (!) 158/81  06/18/22 138/70   Wt Readings from Last 3 Encounters:  10/19/22 170 lb 11.2 oz (77.4 kg)  10/11/22 168 lb 9.6 oz (76.5 kg)  06/18/22 125 lb (56.7 kg) (7 %, Z= -1.50)*   * Growth percentiles are based on CDC (Boys, 2-20 Years) data.     He was last seen for hypertension 8 days ago.  BP at that visit was 158/81. Management since that visit includes measure BP log and reassess in a week .  Outside blood pressures are not being checked. Pt does not have BP cuff. Symptoms: No chest pain No chest pressure  No palpitations No syncope  No dyspnea No orthopnea  No paroxysmal nocturnal dyspnea No lower extremity edema  ---------------------------------------------------------------------------------------------------  Rash improved on triamcinolone, pt prefers not to see dermatology. There were no more episodes of having blood in semen or sharp groin pain. All labs were WNL form 10/11/22. Prefers to see psychology and not psychiatry   Medications: Outpatient Medications Prior to Visit  Medication Sig   triamcinolone cream (KENALOG) 0.1 % Apply 1 Application topically 2 (two) times daily.   No facility-administered medications prior to visit.    Review of Systems  All other systems reviewed and are negative. Except see hpi  {Labs  Heme  Chem  Endocrine  Serology  Results Review (optional):23779}   Objective    BP 134/84    Pulse 86   Ht '5\' 6"'$  (1.676 m)   Wt 170 lb 11.2 oz (77.4 kg)   SpO2 98%   BMI 27.55 kg/m  {Show previous vital signs (optional):23777}  Physical Exam Vitals reviewed.  Constitutional:      General: He is not in acute distress.    Appearance: Normal appearance. He is not diaphoretic.  HENT:     Head: Normocephalic and atraumatic.  Eyes:     General: No scleral icterus.    Conjunctiva/sclera: Conjunctivae normal.  Cardiovascular:     Rate and Rhythm: Normal rate and regular rhythm.     Pulses: Normal pulses.     Heart sounds: Normal heart sounds. No murmur heard. Pulmonary:     Effort: Pulmonary effort is normal. No respiratory distress.     Breath sounds: Normal breath sounds. No wheezing or rhonchi.  Musculoskeletal:     Cervical back: Neck supple.     Right lower leg: No edema.     Left lower leg: No edema.  Lymphadenopathy:     Cervical: No cervical adenopathy.  Skin:    General: Skin is warm and dry.     Findings: No rash.  Neurological:     Mental Status: He is alert and oriented to person, place, and time. Mental status is at baseline.  Psychiatric:        Mood and Affect: Mood normal.        Behavior: Behavior normal.      No results found for any visits  on 10/19/22.  Assessment & Plan     1. Rash Resolved. Continue to use triamcinolone ointment for flare ups Advised to moisturize his skin after shower using body moisturizing lotions/cetaphil Pt would like to cancel his appt with dermatology Will Fu   2. Elevated BP without diagnosis of hypertension BP 147 - first reading and 134 - second reading New problem Family hx of HTN and stroke Advised to measure his BP at home daily Advised to adhere to low-salt diet and daily exercise  Will need labs if elevated BP Will reassess in 2 weeks  3. Blood in semen Resolved All labs including UA, Urine Culture showed negative results for infection  4. Anxiety 5. Depression, recurrent ([place]) Advised to contact  online site for scheduling an online appt with psychology for evaluation. It could be sooner. Pt was informed about a long wait for psychology  appt Referral was placed. Relaxation techniques advised Will check if he wants to try Wellbutrin for depression and anxiety while waiting for an appt with psychology Will fill out ADHD screening Will reassess at his FU  6. Overweight Chronic BMI today was 27.55 Advised daily exercise and healthy diet Will need labs Will reassess in 2 weeks  Return in about 2 weeks (around 11/02/2022) for BP f/u, chronic disease f/u.      The patient was advised to call back or seek an in-person evaluation if the symptoms worsen or if the condition fails to improve as anticipated.  I discussed the assessment and treatment plan with the patient. The patient was provided an opportunity to ask questions and all were answered. The patient agreed with the plan and demonstrated an understanding of the instructions.  I, [name], PA-C have reviewed all documentation for this visit. The documentation on  10/19/22  for the exam, diagnosis, procedures, and orders are all accurate and complete.  [name], [facility], [place] [phone] (phone) [phone] (fax)   [place]

## 2022-10-21 DIAGNOSIS — F419 Anxiety disorder, unspecified: Secondary | ICD-10-CM | POA: Insufficient documentation

## 2022-10-21 DIAGNOSIS — E663 Overweight: Secondary | ICD-10-CM | POA: Insufficient documentation

## 2022-10-21 DIAGNOSIS — R21 Rash and other nonspecific skin eruption: Secondary | ICD-10-CM | POA: Insufficient documentation

## 2022-10-21 DIAGNOSIS — F339 Major depressive disorder, recurrent, unspecified: Secondary | ICD-10-CM | POA: Insufficient documentation

## 2022-10-21 DIAGNOSIS — F900 Attention-deficit hyperactivity disorder, predominantly inattentive type: Secondary | ICD-10-CM | POA: Insufficient documentation

## 2022-10-21 DIAGNOSIS — R361 Hematospermia: Secondary | ICD-10-CM | POA: Insufficient documentation

## 2022-10-21 DIAGNOSIS — R030 Elevated blood-pressure reading, without diagnosis of hypertension: Secondary | ICD-10-CM | POA: Insufficient documentation

## 2022-11-02 ENCOUNTER — Encounter: Payer: Self-pay | Admitting: Physician Assistant

## 2022-11-02 ENCOUNTER — Ambulatory Visit (INDEPENDENT_AMBULATORY_CARE_PROVIDER_SITE_OTHER): Payer: Medicaid Other | Admitting: Physician Assistant

## 2022-11-02 VITALS — BP 128/70 | HR 94 | Wt 172.9 lb

## 2022-11-02 DIAGNOSIS — Z0000 Encounter for general adult medical examination without abnormal findings: Secondary | ICD-10-CM | POA: Diagnosis not present

## 2022-11-02 NOTE — Progress Notes (Signed)
I,Sha'taria Tyson,acting as a Education administrator for [employer], PA-C.,have documented all relevant documentation on the behalf of [name], PA-C,as directed by  [employer], PA-C while in the presence of [employer], PA-C.   Established patient visit   Patient: Brendan Lloyd   DOB: 2001/07/26   22 y.o. Male  MRN: [id number] Visit Date: 11/02/2022  Today's healthcare provider: [name], PA-C  CC: annual exam  Subjective    HPI  Well Adult Physical  Patient here for a comprehensive physical exam.The patient reports no problems  Do you take any herbs or supplements that were not prescribed by a doctor? no  Are you taking calcium supplements? no  Are you taking aspirin daily? no   ---------------------------------------------------------------------------------------------------   Medications: Outpatient Medications Prior to Visit  Medication Sig   triamcinolone cream (KENALOG) 0.1 % Apply 1 Application topically 2 (two) times daily.   No facility-administered medications prior to visit.    Review of Systems  All other systems reviewed and are negative. Except see HPI       Objective    BP 128/70 (BP Location: Left Arm, Patient Position: Sitting, Cuff Size: Large)   Pulse 94   Wt 172 lb 14.4 oz (78.4 kg)   SpO2 99%   BMI 27.91 kg/m     Physical Exam Vitals and nursing note reviewed.  Constitutional:      General: He is not in acute distress.    Appearance: Normal appearance. He is obese. He is not ill-appearing, toxic-appearing or diaphoretic.  HENT:     Head: Normocephalic and atraumatic.     Right Ear: Tympanic membrane, ear canal and external ear normal.     Left Ear: Tympanic membrane, ear canal and external ear normal.     Nose: Congestion and rhinorrhea present.     Mouth/Throat:     Mouth: Mucous membranes are moist.     Pharynx: Oropharynx is clear. No oropharyngeal exudate or posterior oropharyngeal erythema.     Comments: Postnasal  drainage  Eyes:     General: No scleral icterus.       Right eye: No discharge.        Left eye: No discharge.     Extraocular Movements: Extraocular movements intact.     Conjunctiva/sclera: Conjunctivae normal.     Pupils: Pupils are equal, round, and reactive to light.  Neck:     Vascular: No carotid bruit.  Cardiovascular:     Rate and Rhythm: Normal rate and regular rhythm.     Pulses: Normal pulses.     Heart sounds: Normal heart sounds.  Pulmonary:     Effort: Pulmonary effort is normal.     Breath sounds: Normal breath sounds.  Abdominal:     General: Abdomen is flat. Bowel sounds are normal. There is no distension.     Palpations: Abdomen is soft. There is no mass.     Tenderness: There is no abdominal tenderness. There is no right CVA tenderness, left CVA tenderness, guarding or rebound.     Hernia: No hernia is present.  Musculoskeletal:        General: No swelling, deformity or signs of injury. Normal range of motion.     Cervical back: Normal range of motion. No rigidity or tenderness.     Right lower leg: No edema.     Left lower leg: No edema.  Lymphadenopathy:     Cervical: No cervical adenopathy.  Skin:    General: Skin is  warm.     Capillary Refill: Capillary refill takes less than 2 seconds.     Coloration: Skin is not jaundiced or pale.     Findings: No bruising, erythema or lesion.  Neurological:     Mental Status: He is alert and oriented to person, place, and time.     Cranial Nerves: No cranial nerve deficit.     Sensory: No sensory deficit.     Motor: No weakness.     Coordination: Coordination normal.     Gait: Gait normal.     Deep Tendon Reflexes: Reflexes normal.  Psychiatric:        Behavior: Behavior normal.        Thought Content: Thought content normal.        Judgment: Judgment normal.      No results found for any visits on 11/02/22.  Assessment & Plan     1. Annual physical exam UTD on dental/ advised to see eye doctor for an  evaluation Things to do to keep yourself healthy  - Exercise at least 30-45 minutes a day, 3-4 days a week.  - Eat a low-fat diet with lots of fruits and vegetables, up to 7-9 servings per day.  - Seatbelts can save your life. Wear them always.  - Smoke detectors on every level of your home, check batteries every year.  - Eye Doctor - have an eye exam every 1-2 years  - Safe sex - if you may be exposed to STDs, use a condom.  - Alcohol -  If you drink, do it moderately, less than 2 drinks per day.  - [place]. Choose someone to speak for you if you are not able.  - Depression is common in our stressful world.If you're feeling down or losing interest in things you normally enjoy, please come in for a visit.  - Violence - If anyone is threatening or hurting you, please call immediately.    Return in about 1 year (around 11/02/2023) for CPE. Pt prefers to have Tdap at his next CPE. He will think/research more about HPV vaccine.    The patient was advised to call back or seek an in-person evaluation if the symptoms worsen or if the condition fails to improve as anticipated.  I discussed the assessment and treatment plan with the patient. The patient was provided an opportunity to ask questions and all were answered. The patient agreed with the plan and demonstrated an understanding of the instructions.  I, [name], PA-C have reviewed all documentation for this visit. The documentation on  11/02/22 for the exam, diagnosis, procedures, and orders are all accurate and complete.  [name], [facility], [place] [phone] (phone) [phone] (fax)   [place]

## 2022-11-17 ENCOUNTER — Encounter: Payer: Self-pay | Admitting: Psychology

## 2023-09-12 ENCOUNTER — Encounter: Payer: Medicaid Other | Admitting: Psychology

## 2023-10-26 ENCOUNTER — Ambulatory Visit: Payer: Medicaid Other | Admitting: Dermatology

## 2023-11-02 NOTE — Progress Notes (Deleted)
 Complete physical exam  Patient: Brendan Lloyd   DOB: May 03, 2001   22 y.o. Male  MRN: [id number] Visit Date: 11/03/2023  Today's healthcare provider: Debera Lat, PA-C   No chief complaint on file.  Subjective    Brendan Lloyd is a 23 y.o. male who presents today for a complete physical exam.  He reports consuming a {diet types:17450} diet. {Exercise:19826} He generally feels {well/fairly well/poorly:18703}. He reports sleeping {well/fairly well/poorly:18703}. He {does/does not:200015} have additional problems to discuss today.  HPI  *** Discussed the use of AI scribe software for clinical note transcription with the patient, who gave verbal consent to proceed.  History of Present Illness         Vaccines!!!   Last depression screening scores    11/02/2022    9:58 AM 10/19/2022   11:09 AM 10/11/2022    2:20 PM  PHQ 2/9 Scores  PHQ - 2 Score 3 1 3   PHQ- 9 Score 7 10 16    Last fall risk screening    11/02/2022    9:56 AM  Fall Risk   Falls in the past year? 0  Number falls in past yr: 0  Injury with Fall? 0  Risk for fall due to : No Fall Risks  Follow up Falls evaluation completed   Last Audit-C alcohol use screening    11/02/2022    9:56 AM  Alcohol Use Disorder Test (AUDIT)  1. How often do you have a drink containing alcohol? 0  2. How many drinks containing alcohol do you have on a typical day when you are drinking? 0  3. How often do you have six or more drinks on one occasion? 0  AUDIT-C Score 0   A score of 3 or more in women, and 4 or more in men indicates increased risk for alcohol abuse, EXCEPT if all of the points are from question 1   Past Medical History:  Diagnosis Date   Anxiety    Past Surgical History:  Procedure Laterality Date   APPENDECTOMY     XI ROBOTIC LAPAROSCOPIC ASSISTED APPENDECTOMY N/A 01/26/2021   Procedure: XI ROBOTIC LAPAROSCOPIC ASSISTED APPENDECTOMY;  Surgeon: Sung Amabile, DO;  Location: [facility] ORS;  Service: General;   Laterality: N/A;   Social History   Socioeconomic History   Marital status: Single    Spouse name: Not on file   Number of children: Not on file   Years of education: Not on file   Highest education level: Not on file  Occupational History   Not on file  Tobacco Use   Smoking status: Never   Smokeless tobacco: Never  Substance and Sexual Activity   Alcohol use: Never   Drug use: Never   Sexual activity: Never  Other Topics Concern   Not on file  Social History Narrative   Not on file   Social Drivers of Health   Financial Resource Strain: Not on file  Food Insecurity: Not on file  Transportation Needs: Not on file  Physical Activity: Not on file  Stress: Not on file  Social Connections: Not on file  Intimate Partner Violence: Not on file   Family Status  Relation Name Status   Mother Glade Stanford Alive   Father  Alive   MGM  (Not Specified)  No partnership data on file   Family History  Problem Relation Age of Onset   Endometriosis Mother    Sleep apnea Father    Obesity Maternal Grandmother  High Cholesterol Maternal Grandmother    Allergies  Allergen Reactions   Amoxicillin Anaphylaxis    Patient Care Team: Cherlynn Polo as PCP - General (Physician Assistant)   Medications: Outpatient Medications Prior to Visit  Medication Sig   triamcinolone cream (KENALOG) 0.1 % Apply 1 Application topically 2 (two) times daily.   No facility-administered medications prior to visit.    Review of Systems Except see HPI  {Insert previous labs (optional):23779} {See past labs  Heme  Chem  Endocrine  Serology  Results Review (optional):1}  Objective    There were no vitals taken for this visit. {Insert last BP/Wt (optional):23777}{See vitals history (optional):1}    Physical Exam   No results found for any visits on 11/03/23.  Assessment & Plan    Routine Health Maintenance and Physical Exam  Exercise Activities and Dietary  recommendations  Goals   None     Immunization History  Administered Date(s) Administered   PFIZER(Purple Top)SARS-COV-2 Vaccination 12/14/2019, 01/05/2020    Health Maintenance  Topic Date Due   HPV Vaccine (1 - Male 3-dose series) Never done   DTaP/Tdap/Td vaccine (1 - Tdap) Never done   Flu Shot  Never done   COVID-19 Vaccine (3 - 2024-25 season) 04/24/2023   Hepatitis C Screening  Completed   HIV Screening  Completed    Discussed health benefits of physical activity, and encouraged him to engage in regular exercise appropriate for his age and condition.  Assessment and Plan              ***  No follow-ups on file.    The patient was advised to call back or seek an in-person evaluation if the symptoms worsen or if the condition fails to improve as anticipated.  I discussed the assessment and treatment plan with the patient. The patient was provided an opportunity to ask questions and all were answered. The patient agreed with the plan and demonstrated an understanding of the instructions.  I, Debera Lat, PA-C have reviewed all documentation for this visit. The documentation on 11/03/2023  for the exam, diagnosis, procedures, and orders are all accurate and complete.  Debera Lat, [facility], MMS [facility] [phone] (phone) [phone] (fax)  [facility]

## 2023-11-03 ENCOUNTER — Encounter: Payer: Self-pay | Admitting: Physician Assistant

## 2023-11-25 ENCOUNTER — Encounter: Payer: Self-pay | Admitting: Podiatry

## 2023-11-25 ENCOUNTER — Ambulatory Visit (INDEPENDENT_AMBULATORY_CARE_PROVIDER_SITE_OTHER): Admitting: Podiatry

## 2023-11-25 DIAGNOSIS — L600 Ingrowing nail: Secondary | ICD-10-CM

## 2023-11-25 NOTE — Progress Notes (Signed)
  Subjective:  Patient ID: Brendan Lloyd, male    DOB: May 29, 2001,  MRN: [id number]  Chief Complaint  Patient presents with   Toe Pain    Hallux right - lateral border, ingrown x few months, infected x few weeks, tried trimming it, using peroxide to clean, would like the left hallux fixed as well   New Patient (Initial Visit)    23 y.o. male presents with the above complaint.  Patient presents with right hallux lateral border ingrown painful to touch is progressive gotten worse worse with ambulation with pressure patient would like to have removed has not seen MRIs prior to seeing me pain scale 7 out of 10 dull aching nature   Review of Systems: Negative except as noted in the HPI. Denies N/V/F/Ch.  Past Medical History:  Diagnosis Date   Anxiety     Current Outpatient Medications:    triamcinolone cream (KENALOG) 0.1 %, Apply 1 Application topically 2 (two) times daily., Disp: 30 g, Rfl: 0  Social History   Tobacco Use  Smoking Status Never  Smokeless Tobacco Never    Allergies  Allergen Reactions   Amoxicillin Anaphylaxis   Objective:  There were no vitals filed for this visit. There is no height or weight on file to calculate BMI. Constitutional Well developed. Well nourished.  Vascular Dorsalis pedis pulses palpable bilaterally. Posterior tibial pulses palpable bilaterally. Capillary refill normal to all digits.  No cyanosis or clubbing noted. Pedal hair growth normal.  Neurologic Normal speech. Oriented to person, place, and time. Epicritic sensation to light touch grossly present bilaterally.  Dermatologic Painful ingrowing nail at lateral nail borders of the hallux nail right. No other open wounds. No skin lesions.  Orthopedic: Normal joint ROM without pain or crepitus bilaterally. No visible deformities. No bony tenderness.   Radiographs: None Assessment:   1. Ingrown toenail of right foot    Plan:  Patient was evaluated and treated and all questions  answered.  Ingrown Nail, right -Patient elects to proceed with minor surgery to remove ingrown toenail removal today. Consent reviewed and signed by patient. -Ingrown nail excised. See procedure note. -Educated on post-procedure care including soaking. Written instructions provided and reviewed. -Patient to follow up in 2 weeks for nail check.  Procedure: Excision of Ingrown Toenail Location: Right 1st toe lateral nail borders. Anesthesia: Lidocaine 1% plain; 1.5 mL and Marcaine 0.5% plain; 1.5 mL, digital block. Skin Prep: Betadine. Dressing: Silvadene; telfa; dry, sterile, compression dressing. Technique: Following skin prep, the toe was exsanguinated and a tourniquet was secured at the base of the toe. The affected nail border was freed, split with a nail splitter, and excised. Chemical matrixectomy was then performed with phenol and irrigated out with alcohol. The tourniquet was then removed and sterile dressing applied. Disposition: Patient tolerated procedure well. Patient to return in 2 weeks for follow-up.   No follow-ups on file.

## 2023-11-25 NOTE — Patient Instructions (Signed)

## 2023-12-02 ENCOUNTER — Encounter: Admitting: Physician Assistant

## 2024-03-22 ENCOUNTER — Ambulatory Visit (INDEPENDENT_AMBULATORY_CARE_PROVIDER_SITE_OTHER): Admitting: Podiatry

## 2024-03-22 ENCOUNTER — Encounter: Payer: Self-pay | Admitting: Podiatry

## 2024-03-22 DIAGNOSIS — L600 Ingrowing nail: Secondary | ICD-10-CM | POA: Diagnosis not present

## 2024-03-22 MED ORDER — CLOTRIMAZOLE-BETAMETHASONE 1-0.05 % EX CREA: 1.0000 | TOPICAL_CREAM | Freq: Every day | CUTANEOUS | 0 refills | Status: AC

## 2024-03-22 MED ORDER — DOXYCYCLINE HYCLATE 100 MG PO TABS: 100.0000 mg | ORAL_TABLET | Freq: Two times a day (BID) | ORAL | 1 refills | Status: DC

## 2024-03-22 NOTE — Progress Notes (Signed)
 Subjective:   Patient ID: Brendan Lloyd, male   DOB: 23 y.o.   MRN: [id number]   HPI Patient presents stating that he has a painful ingrown toenail of his left big toe and it has been hard for him to wear shoe gear comfortably.  Patient states that he has been soaking it and using spray.  Also has small fungal infection on his lesser digits   ROS      Objective:  Physical Exam  Neurovascular status intact muscle strength adequate range of motion adequate with patient noted to have incurvated left hallux lateral border with slight redness no drainage noted with good digital perfusion well oriented x 3     Assessment:  Grown toenail deformity left hallux with slight redness noted secondary to the significance of the deformity     Plan:  H&P reviewed I do recommend correction I explained procedure risk and I precautionary wise placed him on doxycycline .  I infiltrated the left big toe 60 mg like Marcaine  mixture sterile prep done using sterile instrumentation I removed the border exposed matrix applied phenol 3 applications 30 seconds followed by alcohol lavage and sterile dressing.  Gave instructions on soaks wear dressing 24 hours to get up earlier if throbbing were to occur and encouraged to call questions.  Also placed on Lotrisone  cream to help with the fungal infection of his lesser digits

## 2024-03-22 NOTE — Patient Instructions (Signed)

## 2024-08-09 ENCOUNTER — Ambulatory Visit: Admitting: Physician Assistant

## 2024-08-09 ENCOUNTER — Encounter: Payer: Self-pay | Admitting: Physician Assistant

## 2024-08-09 VITALS — BP 128/78 | HR 75 | Ht 66.0 in | Wt 178.2 lb

## 2024-08-09 DIAGNOSIS — L813 Cafe au lait spots: Secondary | ICD-10-CM | POA: Diagnosis not present

## 2024-08-09 DIAGNOSIS — R5383 Other fatigue: Secondary | ICD-10-CM | POA: Diagnosis not present

## 2024-08-09 DIAGNOSIS — R1013 Epigastric pain: Secondary | ICD-10-CM | POA: Diagnosis not present

## 2024-08-09 DIAGNOSIS — Z8279 Family history of other congenital malformations, deformations and chromosomal abnormalities: Secondary | ICD-10-CM | POA: Diagnosis not present

## 2024-08-09 DIAGNOSIS — Z0000 Encounter for general adult medical examination without abnormal findings: Secondary | ICD-10-CM | POA: Diagnosis not present

## 2024-08-09 DIAGNOSIS — D229 Melanocytic nevi, unspecified: Secondary | ICD-10-CM | POA: Diagnosis not present

## 2024-08-09 DIAGNOSIS — L309 Dermatitis, unspecified: Secondary | ICD-10-CM

## 2024-08-09 DIAGNOSIS — J310 Chronic rhinitis: Secondary | ICD-10-CM | POA: Diagnosis not present

## 2024-08-09 DIAGNOSIS — E663 Overweight: Secondary | ICD-10-CM

## 2024-08-09 NOTE — Progress Notes (Unsigned)
 Complete physical exam  Patient: Brendan Lloyd   DOB: 01-15-2001   23 y.o. Male  MRN: [id number] Visit Date: 08/09/2024  Today's healthcare provider: Jolynn Spencer, PA-C   Chief Complaint  Patient presents with   Annual Exam    Last Exam Physical: 11/02/2022 Diet: no specific regimen.  Exercise: just at work for few minutes Feeling: good Sleeping: 6.5 hours No concerns   Subjective    Brendan Lloyd is a 23 y.o. male who presents today for a complete physical exam.   HPI     Annual Exam    Additional comments: Last Exam Physical: 11/02/2022 Diet: no specific regimen.  Exercise: just at work for few minutes Feeling: good Sleeping: 6.5 hours No concerns      Last edited by Wilfred Hargis RAMAN, CMA on 08/09/2024  9:16 AM.      Discussed the use of AI scribe software for clinical note transcription with the patient, who gave verbal consent to proceed.  History of Present Illness Brendan Lloyd is a 23 year old male who presents with fatigue and post-nasal drip.  He reports daytime fatigue with nodding off during work meetings. 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 He does not take regular medications.  He has chronic post-nasal drip with a sensation of a lump in the back of his throat and intermittent congestion. Symptoms feel typical for him and are worse in [place] . He denies recent viral illness and uses vapor rub as needed.  He has chronic dry, itchy skin that worsens with scratching, suspected eczema, and uses topical triamcinolone .  He occasionally has chest pain that feels like heartburn after eating. He denies bowel or urinary problems.  His sister has neurofibromatosis type 1. He has some small skin spots but has never been diagnosed with neurofibromatosis.    Last depression screening scores    08/09/2024    9:21 AM 11/02/2022    9:58 AM 10/19/2022   11:09 AM   PHQ 2/9 Scores  PHQ - 2 Score 0 3 1  PHQ- 9 Score  7  10      Data saved with a previous flowsheet row definition   Last fall risk screening    08/09/2024    9:21 AM  Fall Risk   Falls in the past year? 0  Number falls in past yr: 0  Injury with Fall? 0  Risk for fall due to : No Fall Risks  Follow up Falls evaluation completed   Last Audit-C alcohol use screening    08/09/2024    9:18 AM  Alcohol Use Disorder Test (AUDIT)  1. How often do you have a drink containing alcohol? 1  2. How many drinks containing alcohol do you have on a typical day when you are drinking? 0  3. How often do you have six or more drinks on one occasion? 0  AUDIT-C Score 1   A score of 3 or more in women, and 4 or more in men indicates increased risk for alcohol abuse, EXCEPT if all of the points are from question 1   Past Medical History:  Diagnosis Date   Anxiety    Past Surgical History:  Procedure Laterality Date   APPENDECTOMY     XI ROBOTIC LAPAROSCOPIC ASSISTED APPENDECTOMY N/A 01/26/2021   Procedure: XI ROBOTIC LAPAROSCOPIC ASSISTED APPENDECTOMY;  Surgeon: Tye Millet, DO;  Location: [facility]  [facility];  Service: General;  Laterality: N/A;   Social History   Socioeconomic History   Marital status: Single    Spouse name: Not on file   Number of children: Not on file   Years of education: Not on file   Highest education level: Not on file  Occupational History   Not on file  Tobacco Use   Smoking status: Never   Smokeless tobacco: Never  Substance and Sexual Activity   Alcohol use: Never   Drug use: Never   Sexual activity: Never  Other Topics Concern   Not on file  Social History Narrative   Not on file   Social Drivers of Health   Tobacco Use: Low Risk (08/09/2024)   Patient History    Smoking Tobacco Use: Never    Smokeless Tobacco Use: Never    Passive Exposure: Not on file  Financial Resource Strain: Low Risk (08/09/2024)   Overall Financial Resource Strain (CARDIA)     Difficulty of Paying Living Expenses: Not hard at all  Food Insecurity: No Food Insecurity (08/09/2024)   Epic    Worried About Radiation Protection Practitioner of Food in the Last Year: Never true    Ran Out of Food in the Last Year: Never true  Transportation Needs: No Transportation Needs (08/09/2024)   Epic    Lack of Transportation (Medical): No    Lack of Transportation (Non-Medical): No  Physical Activity: Sufficiently Active (08/09/2024)   Exercise Vital Sign    Days of Exercise per Week: 5 days    Minutes of Exercise per Session: 50 min  Stress: No Stress Concern Present (08/09/2024)   [employer] of Occupational Health - Occupational Stress Questionnaire    Feeling of Stress: Not at all  Social Connections: Socially Isolated (08/09/2024)   Social Connection and Isolation Panel    Frequency of Communication with Friends and Family: Never    Frequency of Social Gatherings with Friends and Family: More than three times a week    Attends Religious Services: Never    Database Administrator or Organizations: No    Attends Banker Meetings: Never    Marital Status: Never married  Intimate Partner Violence: Not At Risk (08/09/2024)   Epic    Fear of Current or Ex-Partner: No    Emotionally Abused: No    Physically Abused: No    Sexually Abused: No  Depression (PHQ2-9): Low Risk (08/09/2024)   Depression (PHQ2-9)    PHQ-2 Score: 0  Alcohol Screen: Low Risk (08/09/2024)   Alcohol Screen    Last Alcohol Screening Score (AUDIT): 1  Housing: Low Risk (08/09/2024)   Epic    Unable to Pay for Housing in the Last Year: No    Number of Times Moved in the Last Year: 0    Homeless in the Last Year: No  Utilities: Not At Risk (08/09/2024)   Epic    Threatened with loss of utilities: No  Health Literacy: Adequate Health Literacy (08/09/2024)   B1300 Health Literacy    Frequency of need for help with medical instructions: Never   Family Status  Relation Name Status   Mother sinclair ferrier Alive   Father  Alive   MGM  (Not Specified)  No partnership data on file   Family History  Problem Relation Age of Onset   Endometriosis Mother    Sleep apnea Father    Obesity Maternal Grandmother    High Cholesterol Maternal Grandmother    Allergies[1]  Patient Care Team: Cary Lothrop, PA-C as PCP - General (Physician Assistant)   Medications: Show/hide medication list[2]  Review of Systems Except see HPI  {Insert previous labs (optional):23779} {See past labs  Heme  Chem  Endocrine  Serology  Results Review (optional):1}  Objective    BP 128/78   Pulse 75   Ht 5' 6 (1.676 m)   Wt 178 lb 3.2 oz (80.8 kg)   SpO2 100%   BMI 28.76 kg/m  {Insert last BP/Wt (optional):23777}{See vitals history (optional):1}    Physical Exam Vitals reviewed.  Constitutional:      General: He is not in acute distress.    Appearance: Normal appearance. He is well-developed. He is not ill-appearing, toxic-appearing or diaphoretic.  HENT:     Head: Normocephalic and atraumatic.     Right Ear: Tympanic membrane, ear canal and external ear normal.     Left Ear: Tympanic membrane, ear canal and external ear normal.     Nose: Nose normal. No congestion or rhinorrhea.     Mouth/Throat:     Mouth: Mucous membranes are moist.     Pharynx: Oropharynx is clear. No oropharyngeal exudate.  Eyes:     General: No scleral icterus.       Right eye: No discharge.        Left eye: No discharge.     Conjunctiva/sclera: Conjunctivae normal.     Pupils: Pupils are equal, round, and reactive to light.  Neck:     Thyroid: No thyromegaly.     Vascular: No carotid bruit.  Cardiovascular:     Rate and Rhythm: Normal rate and regular rhythm.     Pulses: Normal pulses.     Heart sounds: Normal heart sounds. No murmur heard.    No friction rub. No gallop.  Pulmonary:     Effort: Pulmonary effort is normal. No respiratory distress.     Breath sounds: Normal breath sounds. No wheezing or  rales.  Abdominal:     General: Abdomen is flat. Bowel sounds are normal. There is no distension.     Palpations: Abdomen is soft. There is no mass.     Tenderness: There is no abdominal tenderness. There is no right CVA tenderness, left CVA tenderness, guarding or rebound.     Hernia: No hernia is present.  Musculoskeletal:        General: No swelling, tenderness, deformity or signs of injury. Normal range of motion.     Cervical back: Normal range of motion and neck supple. No rigidity or tenderness.     Right lower leg: No edema.     Left lower leg: No edema.  Lymphadenopathy:     Cervical: No cervical adenopathy.  Skin:    General: Skin is warm and dry.     Coloration: Skin is not jaundiced or pale.     Findings: No bruising, erythema, lesion or rash.  Neurological:     Mental Status: He is alert and oriented to person, place, and time. Mental status is at baseline.     Gait: Gait normal.  Psychiatric:        Mood and Affect: Mood normal.        Behavior: Behavior normal.        Thought Content: Thought content normal.        Judgment: Judgment normal.      No results found for any visits on 08/09/24.  Assessment & Plan    Routine Health Maintenance and Physical Exam  Exercise Activities and Dietary recommendations  Goals   None     Immunization History  Administered Date(s) Administered   PFIZER(Purple Top)SARS-COV-2 Vaccination 12/14/2019, 01/05/2020    Health Maintenance  Topic Date Due   HPV Vaccine (1 - Male 3-dose series) Never done   Meningitis B Vaccine (1 of 2 - Standard) Never done   DTaP/Tdap/Td vaccine (1 - Tdap) Never done   Hepatitis B Vaccine (1 of 3 - 19+ 3-dose series) Never done   Flu Shot  Never done   COVID-19 Vaccine (3 - 2025-26 season) 04/23/2024   Hepatitis C Screening  Completed   HIV Screening  Completed   Pneumococcal Vaccine  Aged Out    Discussed health benefits of physical activity, and encouraged him to engage in regular  exercise appropriate for his age and condition.  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 Symptoms worsen with scratching. - Referred to dermatologist for further evaluation and management. - Prescribed topical steroids, specifically triamcinolone , for symptomatic relief. Consider genetic studies Will follow-up  Chronic rhinitis Post nasal drainage likely due to seasonal allergies with congestion and sensation of a lump in the throat. - Recommended nasal saline spray daily. - Prescribed Flonase for nasal congestion. - Advised use of antihistamines such as Allegra or Claritin. - Suggested gargling after meals to alleviate throat symptoms.  Overweight Overweight status. - Encouraged continuation of current diet and exercise regimen. - Recommended starting multivitamins for overall health.  Other fatigue Intermittent fatigue with occasional nodding off at work. Possible contributing factors include work schedule and sleep patterns. - Ordered blood tests to check vitamin B12 and magnesium levels.  Epigastric pain Intermittent epigastric pain associated with eating, likely heartburn. - Advised avoiding eating before bedtime. - Instructed to return if symptoms worsen.  General Health Maintenance Routine health maintenance discussed, including eye and dental check-ups. - Advised scheduling appointments on Fridays due to work schedule.   Overweight (BMI 25.0-29.9)  - CBC with Differential/Platelet - Comprehensive metabolic panel with GFR - Hemoglobin A1c - Lipid panel - TSH - Vitamin B12 - Magnesium - VITAMIN D  25 Hydroxy (Vit-D Deficiency, Fractures)  Other fatigue - CBC with Differential/Platelet - Comprehensive metabolic panel with GFR - Hemoglobin A1c - Lipid panel - TSH - Vitamin B12 - Magnesium - VITAMIN D  25 Hydroxy (Vit-D  Deficiency, Fractures)  Annual physical exam (Primary) UTD on dental/eye Things to do to keep yourself healthy  - Exercise at least 30-45 minutes a day, 3-4 days a week.  - Eat a low-fat diet with lots of fruits and vegetables, up to 7-9 servings per day.  - Seatbelts can save your life. Wear them always.  - Smoke detectors on every level of your home, check batteries every year.  - Eye Doctor - have an eye exam every 1-2 years  - Safe sex - if you may be exposed to STDs, use a condom.  - Alcohol -  If you drink, do it moderately, less than 2 drinks per day.  - Health Care Power of Attorney. Choose someone to speak for you if you are not able.  - Depression is common in our stressful world.If you're feeling down or losing interest in things you normally enjoy, please come in for a visit.  - Violence - If anyone is threatening or hurting you, please call immediately.   No follow-ups on file.    The patient was advised to call  back or seek an in-person evaluation if the symptoms worsen or if the condition fails to improve as anticipated.  I discussed the assessment and treatment plan with the patient. The patient was provided an opportunity to ask questions and all were answered. The patient agreed with the plan and demonstrated an understanding of the instructions.  I, Gianah Batt, PA-C have reviewed all documentation for this visit. The documentation on 08/09/2024  for the exam, diagnosis, procedures, and orders are all accurate and complete.  Jolynn Spencer, [facility], MMS [facility] [phone] (phone) [phone] (fax)  [facility]      [1]  Allergies Allergen Reactions   Amoxicillin Anaphylaxis  [2]  Outpatient Medications Prior to Visit  Medication Sig   clotrimazole -betamethasone  (LOTRISONE ) cream Apply 1 Application topically daily.   triamcinolone  cream (KENALOG ) 0.1 % Apply 1 Application topically 2 (two) times daily.   [DISCONTINUED]  doxycycline  (VIBRA -TABS) 100 MG tablet Take 1 tablet (100 mg total) by mouth 2 (two) times daily.   No facility-administered medications prior to visit.

## 2024-08-10 LAB — CBC WITH DIFFERENTIAL/PLATELET
Basophils Absolute: 0 x10E3/uL (ref 0.0–0.2)
Basos: 1 %
EOS (ABSOLUTE): 0.1 x10E3/uL (ref 0.0–0.4)
Eos: 1 %
Hematocrit: 50.1 % (ref 37.5–51.0)
Hemoglobin: 16.5 g/dL (ref 13.0–17.7)
Immature Grans (Abs): 0 x10E3/uL (ref 0.0–0.1)
Immature Granulocytes: 0 %
Lymphocytes Absolute: 2.2 x10E3/uL (ref 0.7–3.1)
Lymphs: 26 %
MCH: 28.7 pg (ref 26.6–33.0)
MCHC: 32.9 g/dL (ref 31.5–35.7)
MCV: 87 fL (ref 79–97)
Monocytes Absolute: 1 x10E3/uL — ABNORMAL HIGH (ref 0.1–0.9)
Monocytes: 11 %
Neutrophils Absolute: 5.1 x10E3/uL (ref 1.4–7.0)
Neutrophils: 61 %
Platelets: 293 x10E3/uL (ref 150–450)
RBC: 5.74 x10E6/uL (ref 4.14–5.80)
RDW: 12.2 % (ref 11.6–15.4)
WBC: 8.5 x10E3/uL (ref 3.4–10.8)

## 2024-08-10 LAB — COMPREHENSIVE METABOLIC PANEL WITH GFR
ALT: 23 IU/L (ref 0–44)
AST: 18 IU/L (ref 0–40)
Albumin: 4.9 g/dL (ref 4.3–5.2)
Alkaline Phosphatase: 71 IU/L (ref 47–123)
BUN/Creatinine Ratio: 19 (ref 9–20)
BUN: 15 mg/dL (ref 6–20)
Bilirubin Total: 0.8 mg/dL (ref 0.0–1.2)
CO2: 20 mmol/L (ref 20–29)
Calcium: 10.1 mg/dL (ref 8.7–10.2)
Chloride: 100 mmol/L (ref 96–106)
Creatinine, Ser: 0.79 mg/dL (ref 0.76–1.27)
Globulin, Total: 2.2 g/dL (ref 1.5–4.5)
Glucose: 83 mg/dL (ref 70–99)
Potassium: 4.5 mmol/L (ref 3.5–5.2)
Sodium: 139 mmol/L (ref 134–144)
Total Protein: 7.1 g/dL (ref 6.0–8.5)
eGFR: 129 mL/min/1.73

## 2024-08-10 LAB — LIPID PANEL
Chol/HDL Ratio: 4.5 ratio (ref 0.0–5.0)
Cholesterol, Total: 217 mg/dL — ABNORMAL HIGH (ref 100–199)
HDL: 48 mg/dL
LDL Chol Calc (NIH): 146 mg/dL — ABNORMAL HIGH (ref 0–99)
Triglycerides: 127 mg/dL (ref 0–149)
VLDL Cholesterol Cal: 23 mg/dL (ref 5–40)

## 2024-08-10 LAB — VITAMIN D 25 HYDROXY (VIT D DEFICIENCY, FRACTURES): Vit D, 25-Hydroxy: 8.2 ng/mL — ABNORMAL LOW (ref 30.0–100.0)

## 2024-08-10 LAB — TSH: TSH: 1.43 u[IU]/mL (ref 0.450–4.500)

## 2024-08-10 LAB — HEMOGLOBIN A1C
Est. average glucose Bld gHb Est-mCnc: 97 mg/dL
Hgb A1c MFr Bld: 5 % (ref 4.8–5.6)

## 2024-08-10 LAB — MAGNESIUM: Magnesium: 2.1 mg/dL (ref 1.6–2.3)

## 2024-08-10 LAB — VITAMIN B12: Vitamin B-12: 622 pg/mL (ref 232–1245)

## 2024-08-13 ENCOUNTER — Ambulatory Visit: Payer: Self-pay | Admitting: Physician Assistant

## 2024-08-13 DIAGNOSIS — E559 Vitamin D deficiency, unspecified: Secondary | ICD-10-CM

## 2024-08-13 MED ORDER — VITAMIN D (ERGOCALCIFEROL) 1.25 MG (50000 UNIT) PO CAPS: 50000.0000 [IU] | ORAL_CAPSULE | ORAL | 0 refills | Status: AC

## 2025-08-12 ENCOUNTER — Encounter: Admitting: Physician Assistant
